# Patient Record
Sex: Female | Born: 1980 | Race: White | Hispanic: No
Health system: Southern US, Community
[De-identification: ages and names within clinical notes are randomized; demographics above are authoritative.]

## PROBLEM LIST (undated history)

## (undated) DIAGNOSIS — N83209 Unspecified ovarian cyst, unspecified side: Secondary | ICD-10-CM

## (undated) DIAGNOSIS — F419 Anxiety disorder, unspecified: Secondary | ICD-10-CM

## (undated) DIAGNOSIS — I456 Pre-excitation syndrome: Secondary | ICD-10-CM

## (undated) DIAGNOSIS — K219 Gastro-esophageal reflux disease without esophagitis: Secondary | ICD-10-CM

## (undated) DIAGNOSIS — F329 Major depressive disorder, single episode, unspecified: Secondary | ICD-10-CM

## (undated) DIAGNOSIS — F32A Depression, unspecified: Secondary | ICD-10-CM

---
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
## 2009-12-07 DIAGNOSIS — N83209 Unspecified ovarian cyst, unspecified side: Secondary | ICD-10-CM

## 2009-12-07 HISTORY — DX: Unspecified ovarian cyst, unspecified side: N83.209

## 2010-10-31 ENCOUNTER — Encounter: Payer: Self-pay | Admitting: Emergency Medicine

## 2010-11-01 ENCOUNTER — Observation Stay (HOSPITAL_COMMUNITY)
Admission: AD | Admit: 2010-11-01 | Discharge: 2010-11-02 | Payer: Self-pay | Source: Home / Self Care | Admitting: Obstetrics and Gynecology

## 2010-11-08 ENCOUNTER — Ambulatory Visit (HOSPITAL_COMMUNITY)
Admission: AD | Admit: 2010-11-08 | Discharge: 2010-11-08 | Payer: Self-pay | Source: Home / Self Care | Admitting: Obstetrics and Gynecology

## 2011-02-17 LAB — URINALYSIS, ROUTINE W REFLEX MICROSCOPIC
Hgb urine dipstick: NEGATIVE
Nitrite: NEGATIVE
Protein, ur: NEGATIVE mg/dL
Specific Gravity, Urine: 1.022 (ref 1.005–1.030)
Urobilinogen, UA: 0.2 mg/dL (ref 0.0–1.0)

## 2011-02-17 LAB — CBC
HCT: 29.7 % — ABNORMAL LOW (ref 36.0–46.0)
HCT: 30.3 % — ABNORMAL LOW (ref 36.0–46.0)
HCT: 31.4 % — ABNORMAL LOW (ref 36.0–46.0)
HCT: 35 % — ABNORMAL LOW (ref 36.0–46.0)
Hemoglobin: 10.5 g/dL — ABNORMAL LOW (ref 12.0–15.0)
Hemoglobin: 12.2 g/dL (ref 12.0–15.0)
MCH: 31.6 pg (ref 26.0–34.0)
MCH: 31.6 pg (ref 26.0–34.0)
MCH: 32.6 pg (ref 26.0–34.0)
MCHC: 34.7 g/dL (ref 30.0–36.0)
MCHC: 34.8 g/dL (ref 30.0–36.0)
MCHC: 34.9 g/dL (ref 30.0–36.0)
MCHC: 35.1 g/dL (ref 30.0–36.0)
MCHC: 35.3 g/dL (ref 30.0–36.0)
MCV: 88.6 fL (ref 78.0–100.0)
MCV: 89.6 fL (ref 78.0–100.0)
MCV: 93.2 fL (ref 78.0–100.0)
MCV: 94.4 fL (ref 78.0–100.0)
Platelets: 206 10*3/uL (ref 150–400)
Platelets: 214 10*3/uL (ref 150–400)
Platelets: 221 10*3/uL (ref 150–400)
Platelets: 260 10*3/uL (ref 150–400)
RBC: 3.35 MIL/uL — ABNORMAL LOW (ref 3.87–5.11)
RBC: 4.05 MIL/uL (ref 3.87–5.11)
RDW: 12.6 % (ref 11.5–15.5)
RDW: 12.6 % (ref 11.5–15.5)
RDW: 12.6 % (ref 11.5–15.5)
RDW: 12.7 % (ref 11.5–15.5)
RDW: 12.8 % (ref 11.5–15.5)
WBC: 5.5 10*3/uL (ref 4.0–10.5)
WBC: 7.7 10*3/uL (ref 4.0–10.5)

## 2011-02-17 LAB — HEPATIC FUNCTION PANEL
AST: 67 U/L — ABNORMAL HIGH (ref 0–37)
Albumin: 3.8 g/dL (ref 3.5–5.2)
Albumin: 3.9 g/dL (ref 3.5–5.2)
Alkaline Phosphatase: 50 U/L (ref 39–117)
Alkaline Phosphatase: 50 U/L (ref 39–117)
Bilirubin, Direct: 0.1 mg/dL (ref 0.0–0.3)
Bilirubin, Direct: 0.3 mg/dL (ref 0.0–0.3)
Indirect Bilirubin: 0.9 mg/dL (ref 0.3–0.9)
Total Bilirubin: 0.5 mg/dL (ref 0.3–1.2)
Total Bilirubin: 1.2 mg/dL (ref 0.3–1.2)

## 2011-02-17 LAB — DIFFERENTIAL
Basophils Absolute: 0 10*3/uL (ref 0.0–0.1)
Basophils Absolute: 0.1 10*3/uL (ref 0.0–0.1)
Basophils Relative: 0 % (ref 0–1)
Basophils Relative: 2 % — ABNORMAL HIGH (ref 0–1)
Eosinophils Absolute: 0.1 10*3/uL (ref 0.0–0.7)
Eosinophils Absolute: 0.1 10*3/uL (ref 0.0–0.7)
Eosinophils Relative: 1 % (ref 0–5)
Eosinophils Relative: 2 % (ref 0–5)
Monocytes Absolute: 0.4 10*3/uL (ref 0.1–1.0)
Neutrophils Relative %: 70 % (ref 43–77)

## 2011-02-17 LAB — POCT I-STAT, CHEM 8
Creatinine, Ser: 0.8 mg/dL (ref 0.4–1.2)
HCT: 38 % (ref 36.0–46.0)
Hemoglobin: 12.9 g/dL (ref 12.0–15.0)
Potassium: 3.9 mEq/L (ref 3.5–5.1)
Sodium: 139 mEq/L (ref 135–145)
TCO2: 24 mmol/L (ref 0–100)

## 2011-02-17 LAB — HCG, QUANTITATIVE, PREGNANCY
hCG, Beta Chain, Quant, S: 43 m[IU]/mL — ABNORMAL HIGH (ref <5)
hCG, Beta Chain, Quant, S: 51 m[IU]/mL — ABNORMAL HIGH (ref <5)
hCG, Beta Chain, Quant, S: 86 m[IU]/mL — ABNORMAL HIGH (ref <5)

## 2011-02-17 LAB — LIPASE, BLOOD: Lipase: 17 U/L (ref 11–59)

## 2011-02-17 LAB — ABO/RH: ABO/RH(D): O POS

## 2011-02-17 LAB — GLUCOSE, CAPILLARY: Glucose-Capillary: 113 mg/dL — ABNORMAL HIGH (ref 70–99)

## 2011-06-03 ENCOUNTER — Other Ambulatory Visit: Payer: Self-pay | Admitting: Obstetrics and Gynecology

## 2011-06-03 DIAGNOSIS — Z1231 Encounter for screening mammogram for malignant neoplasm of breast: Secondary | ICD-10-CM

## 2011-06-03 DIAGNOSIS — Z803 Family history of malignant neoplasm of breast: Secondary | ICD-10-CM

## 2011-06-18 ENCOUNTER — Ambulatory Visit: Payer: Self-pay

## 2012-12-27 LAB — OB RESULTS CONSOLE GC/CHLAMYDIA
Chlamydia: NEGATIVE
Gonorrhea: NEGATIVE

## 2012-12-27 LAB — OB RESULTS CONSOLE HEPATITIS B SURFACE ANTIGEN: Hepatitis B Surface Ag: NEGATIVE

## 2013-07-05 LAB — OB RESULTS CONSOLE GBS: GBS: NEGATIVE

## 2013-07-28 ENCOUNTER — Inpatient Hospital Stay (HOSPITAL_COMMUNITY)
Admission: AD | Admit: 2013-07-28 | Discharge: 2013-07-30 | DRG: 371 | Disposition: A | Discharge status: Home / Self Care | Payer: BC Managed Care – PPO | Source: Ambulatory Visit | Attending: Obstetrics and Gynecology | Admitting: Obstetrics and Gynecology

## 2013-07-28 ENCOUNTER — Inpatient Hospital Stay (HOSPITAL_COMMUNITY): Payer: BC Managed Care – PPO | Admitting: Anesthesiology

## 2013-07-28 ENCOUNTER — Encounter (HOSPITAL_COMMUNITY): Payer: Self-pay | Admitting: Anesthesiology

## 2013-07-28 ENCOUNTER — Encounter (HOSPITAL_COMMUNITY): Payer: Self-pay

## 2013-07-28 ENCOUNTER — Encounter (HOSPITAL_COMMUNITY)
Admission: AD | Disposition: A | Discharge status: Home / Self Care | Payer: Self-pay | Source: Ambulatory Visit | Attending: Obstetrics and Gynecology

## 2013-07-28 DIAGNOSIS — O411090 Infection of amniotic sac and membranes, unspecified, unspecified trimester, not applicable or unspecified: Secondary | ICD-10-CM | POA: Diagnosis present

## 2013-07-28 DIAGNOSIS — Z98891 History of uterine scar from previous surgery: Secondary | ICD-10-CM

## 2013-07-28 DIAGNOSIS — Z8759 Personal history of other complications of pregnancy, childbirth and the puerperium: Secondary | ICD-10-CM

## 2013-07-28 DIAGNOSIS — O4290 Premature rupture of membranes, unspecified as to length of time between rupture and onset of labor, unspecified weeks of gestation: Secondary | ICD-10-CM | POA: Diagnosis not present

## 2013-07-28 HISTORY — DX: Anxiety disorder, unspecified: F41.9

## 2013-07-28 HISTORY — DX: Pre-excitation syndrome: I45.6

## 2013-07-28 HISTORY — DX: Depression, unspecified: F32.A

## 2013-07-28 HISTORY — DX: Unspecified ovarian cyst, unspecified side: N83.209

## 2013-07-28 HISTORY — DX: Major depressive disorder, single episode, unspecified: F32.9

## 2013-07-28 LAB — CBC
MCH: 32.3 pg (ref 26.0–34.0)
MCV: 91.1 fL (ref 78.0–100.0)
Platelets: 266 10*3/uL (ref 150–400)
RDW: 13.8 % (ref 11.5–15.5)

## 2013-07-28 SURGERY — Surgical Case
Anesthesia: Epidural | Site: Abdomen | Wound class: Clean Contaminated

## 2013-07-28 MED ORDER — KETOROLAC TROMETHAMINE 60 MG/2ML IM SOLN
INTRAMUSCULAR | Status: AC
  Administered 2013-07-28: 60 mg via INTRAMUSCULAR
  Filled 2013-07-28: qty 2

## 2013-07-28 MED ORDER — TERBUTALINE SULFATE 1 MG/ML IJ SOLN: 0.2500 mg | Freq: Once | INTRAMUSCULAR | Status: DC | PRN

## 2013-07-28 MED ORDER — ACETAMINOPHEN 325 MG PO TABS
650.0000 mg | ORAL_TABLET | ORAL | Status: DC | PRN
  Administered 2013-07-28 (×2): 650 mg via ORAL
  Filled 2013-07-28 (×3): qty 2

## 2013-07-28 MED ORDER — CEFAZOLIN SODIUM-DEXTROSE 2-3 GM-% IV SOLR
INTRAVENOUS | Status: AC
  Filled 2013-07-28: qty 50

## 2013-07-28 MED ORDER — BUTORPHANOL TARTRATE 1 MG/ML IJ SOLN
1.0000 mg | Freq: Once | INTRAMUSCULAR | Status: AC
  Administered 2013-07-28: 1 mg via INTRAMUSCULAR
  Filled 2013-07-28: qty 1

## 2013-07-28 MED ORDER — CITRIC ACID-SODIUM CITRATE 334-500 MG/5ML PO SOLN
30.0000 mL | ORAL | Status: DC | PRN
  Administered 2013-07-28: 30 mL via ORAL
  Filled 2013-07-28: qty 15

## 2013-07-28 MED ORDER — ONDANSETRON HCL 4 MG/2ML IJ SOLN: 4.0000 mg | Freq: Four times a day (QID) | INTRAMUSCULAR | Status: DC | PRN

## 2013-07-28 MED ORDER — PHENYLEPHRINE 40 MCG/ML (10ML) SYRINGE FOR IV PUSH (FOR BLOOD PRESSURE SUPPORT)
80.0000 ug | PREFILLED_SYRINGE | INTRAVENOUS | Status: DC | PRN
  Filled 2013-07-28: qty 5

## 2013-07-28 MED ORDER — OXYTOCIN BOLUS FROM INFUSION: 500.0000 mL | INTRAVENOUS | Status: DC

## 2013-07-28 MED ORDER — CEFAZOLIN SODIUM 1-5 GM-% IV SOLN
INTRAVENOUS | Status: DC | PRN
  Administered 2013-07-28: 2 g via INTRAVENOUS

## 2013-07-28 MED ORDER — SODIUM BICARBONATE 8.4 % IV SOLN
INTRAVENOUS | Status: AC
  Filled 2013-07-28: qty 50

## 2013-07-28 MED ORDER — GENTAMICIN SULFATE 40 MG/ML IJ SOLN
130.0000 mg | Freq: Three times a day (TID) | INTRAVENOUS | Status: DC
  Administered 2013-07-28: 130 mg via INTRAVENOUS
  Filled 2013-07-28 (×4): qty 3.25

## 2013-07-28 MED ORDER — LACTATED RINGERS IV SOLN
INTRAVENOUS | Status: DC
  Administered 2013-07-28: 17:00:00 via INTRAVENOUS

## 2013-07-28 MED ORDER — SODIUM CHLORIDE 0.9 % IV SOLN
2.0000 g | Freq: Once | INTRAVENOUS | Status: AC
  Administered 2013-07-28: 2 g via INTRAVENOUS
  Filled 2013-07-28: qty 2000

## 2013-07-28 MED ORDER — PROMETHAZINE HCL 25 MG/ML IJ SOLN: 6.2500 mg | INTRAMUSCULAR | Status: DC | PRN

## 2013-07-28 MED ORDER — SODIUM CHLORIDE 0.9 % IV SOLN
1.0000 g | INTRAVENOUS | Status: DC
  Administered 2013-07-28: 1 g via INTRAVENOUS
  Filled 2013-07-28 (×6): qty 1000

## 2013-07-28 MED ORDER — LIDOCAINE HCL (PF) 1 % IJ SOLN: 30.0000 mL | INTRAMUSCULAR | Status: DC | PRN

## 2013-07-28 MED ORDER — FENTANYL CITRATE 0.05 MG/ML IJ SOLN
25.0000 ug | INTRAMUSCULAR | Status: DC | PRN
  Administered 2013-07-28: 50 ug via INTRAVENOUS

## 2013-07-28 MED ORDER — SODIUM CHLORIDE 0.9 % IV SOLN: 1.0000 g | INTRAVENOUS | Status: DC

## 2013-07-28 MED ORDER — FLEET ENEMA 7-19 GM/118ML RE ENEM: 1.0000 | ENEMA | RECTAL | Status: DC | PRN

## 2013-07-28 MED ORDER — FENTANYL 2.5 MCG/ML BUPIVACAINE 1/10 % EPIDURAL INFUSION (WH - ANES)
14.0000 mL/h | INTRAMUSCULAR | Status: DC | PRN
  Administered 2013-07-28: 14 mL/h via EPIDURAL
  Filled 2013-07-28 (×3): qty 125

## 2013-07-28 MED ORDER — OXYCODONE-ACETAMINOPHEN 5-325 MG PO TABS: 1.0000 | ORAL_TABLET | ORAL | Status: DC | PRN

## 2013-07-28 MED ORDER — FENTANYL CITRATE 0.05 MG/ML IJ SOLN
INTRAMUSCULAR | Status: AC
  Administered 2013-07-28: 50 ug via INTRAVENOUS
  Filled 2013-07-28: qty 2

## 2013-07-28 MED ORDER — PHENYLEPHRINE 40 MCG/ML (10ML) SYRINGE FOR IV PUSH (FOR BLOOD PRESSURE SUPPORT): 80.0000 ug | PREFILLED_SYRINGE | INTRAVENOUS | Status: DC | PRN

## 2013-07-28 MED ORDER — LACTATED RINGERS IV SOLN: 500.0000 mL | INTRAVENOUS | Status: DC | PRN

## 2013-07-28 MED ORDER — ONDANSETRON HCL 4 MG/2ML IJ SOLN
INTRAMUSCULAR | Status: DC | PRN
  Administered 2013-07-28: 4 mg via INTRAVENOUS

## 2013-07-28 MED ORDER — LIDOCAINE-EPINEPHRINE (PF) 2 %-1:200000 IJ SOLN
INTRAMUSCULAR | Status: AC
  Filled 2013-07-28: qty 20

## 2013-07-28 MED ORDER — MEPERIDINE HCL 25 MG/ML IJ SOLN
INTRAMUSCULAR | Status: AC
  Filled 2013-07-28: qty 1

## 2013-07-28 MED ORDER — OXYTOCIN 10 UNIT/ML IJ SOLN
INTRAMUSCULAR | Status: AC
  Filled 2013-07-28: qty 4

## 2013-07-28 MED ORDER — SCOPOLAMINE 1 MG/3DAYS TD PT72
MEDICATED_PATCH | TRANSDERMAL | Status: AC
  Administered 2013-07-28: 1.5 mg via TRANSDERMAL
  Filled 2013-07-28: qty 1

## 2013-07-28 MED ORDER — MEPERIDINE HCL 25 MG/ML IJ SOLN
INTRAMUSCULAR | Status: DC | PRN
  Administered 2013-07-28: 12.5 mg via INTRAVENOUS

## 2013-07-28 MED ORDER — IBUPROFEN 600 MG PO TABS: 600.0000 mg | ORAL_TABLET | Freq: Four times a day (QID) | ORAL | Status: DC | PRN

## 2013-07-28 MED ORDER — EPHEDRINE 5 MG/ML INJ
10.0000 mg | INTRAVENOUS | Status: DC | PRN
  Filled 2013-07-28: qty 4

## 2013-07-28 MED ORDER — OXYTOCIN 40 UNITS IN LACTATED RINGERS INFUSION - SIMPLE MED: 62.5000 mL/h | INTRAVENOUS | Status: DC

## 2013-07-28 MED ORDER — OXYTOCIN 40 UNITS IN LACTATED RINGERS INFUSION - SIMPLE MED
1.0000 m[IU]/min | INTRAVENOUS | Status: DC
  Administered 2013-07-28: 1 m[IU]/min via INTRAVENOUS

## 2013-07-28 MED ORDER — SODIUM BICARBONATE 8.4 % IV SOLN
INTRAVENOUS | Status: DC | PRN
  Administered 2013-07-28: 7 mL via EPIDURAL
  Administered 2013-07-28: 3 mL via EPIDURAL
  Administered 2013-07-28: 5 mL via EPIDURAL

## 2013-07-28 MED ORDER — OXYTOCIN 10 UNIT/ML IJ SOLN
40.0000 [IU] | INTRAVENOUS | Status: DC | PRN
  Administered 2013-07-28: 40 [IU] via INTRAVENOUS

## 2013-07-28 MED ORDER — LACTATED RINGERS IV SOLN: INTRAVENOUS | Status: DC

## 2013-07-28 MED ORDER — MORPHINE SULFATE 0.5 MG/ML IJ SOLN
INTRAMUSCULAR | Status: AC
  Filled 2013-07-28: qty 10

## 2013-07-28 MED ORDER — PROMETHAZINE HCL 25 MG/ML IJ SOLN
12.5000 mg | Freq: Once | INTRAMUSCULAR | Status: AC
  Administered 2013-07-28: 12.5 mg via INTRAMUSCULAR
  Filled 2013-07-28: qty 1

## 2013-07-28 MED ORDER — LACTATED RINGERS IV SOLN
INTRAVENOUS | Status: DC | PRN
  Administered 2013-07-28 (×3): via INTRAVENOUS

## 2013-07-28 MED ORDER — PHENYLEPHRINE HCL 10 MG/ML IJ SOLN
INTRAMUSCULAR | Status: DC | PRN
  Administered 2013-07-28 (×4): 80 ug via INTRAVENOUS
  Administered 2013-07-28: 40 ug via INTRAVENOUS
  Administered 2013-07-28: 80 ug via INTRAVENOUS
  Administered 2013-07-28: 40 ug via INTRAVENOUS

## 2013-07-28 MED ORDER — PHENYLEPHRINE 40 MCG/ML (10ML) SYRINGE FOR IV PUSH (FOR BLOOD PRESSURE SUPPORT)
PREFILLED_SYRINGE | INTRAVENOUS | Status: AC
  Filled 2013-07-28: qty 5

## 2013-07-28 MED ORDER — PHENYLEPHRINE 40 MCG/ML (10ML) SYRINGE FOR IV PUSH (FOR BLOOD PRESSURE SUPPORT)
PREFILLED_SYRINGE | INTRAVENOUS | Status: AC
  Filled 2013-07-28: qty 15

## 2013-07-28 MED ORDER — MEPERIDINE HCL 25 MG/ML IJ SOLN: 6.2500 mg | INTRAMUSCULAR | Status: DC | PRN

## 2013-07-28 MED ORDER — KETOROLAC TROMETHAMINE 60 MG/2ML IM SOLN: 60.0000 mg | Freq: Once | INTRAMUSCULAR | Status: AC | PRN

## 2013-07-28 MED ORDER — OXYTOCIN 40 UNITS IN LACTATED RINGERS INFUSION - SIMPLE MED
1.0000 m[IU]/min | INTRAVENOUS | Status: DC
  Filled 2013-07-28: qty 1000

## 2013-07-28 MED ORDER — SCOPOLAMINE 1 MG/3DAYS TD PT72: 1.0000 | MEDICATED_PATCH | Freq: Once | TRANSDERMAL | Status: DC

## 2013-07-28 MED ORDER — EPHEDRINE 5 MG/ML INJ
INTRAVENOUS | Status: AC
  Filled 2013-07-28: qty 10

## 2013-07-28 MED ORDER — METOCLOPRAMIDE HCL 5 MG/ML IJ SOLN
INTRAMUSCULAR | Status: DC | PRN
  Administered 2013-07-28: 10 mg via INTRAVENOUS

## 2013-07-28 MED ORDER — ONDANSETRON HCL 4 MG/2ML IJ SOLN
INTRAMUSCULAR | Status: AC
  Filled 2013-07-28: qty 2

## 2013-07-28 MED ORDER — LACTATED RINGERS IV SOLN
INTRAVENOUS | Status: DC | PRN
  Administered 2013-07-28: 21:00:00 via INTRAVENOUS

## 2013-07-28 MED ORDER — DIPHENHYDRAMINE HCL 50 MG/ML IJ SOLN: 12.5000 mg | INTRAMUSCULAR | Status: DC | PRN

## 2013-07-28 MED ORDER — EPHEDRINE 5 MG/ML INJ
10.0000 mg | INTRAVENOUS | Status: DC | PRN
  Administered 2013-07-28: 10 mg via INTRAVENOUS

## 2013-07-28 MED ORDER — LACTATED RINGERS IV SOLN
500.0000 mL | Freq: Once | INTRAVENOUS | Status: AC
  Administered 2013-07-28: 500 mL via INTRAVENOUS

## 2013-07-28 SURGICAL SUPPLY — 30 items
CLAMP CORD UMBIL (MISCELLANEOUS) IMPLANT
CLOTH BEACON ORANGE TIMEOUT ST (SAFETY) ×2 IMPLANT
DERMABOND ADHESIVE PROPEN (GAUZE/BANDAGES/DRESSINGS) ×1
DERMABOND ADVANCED (GAUZE/BANDAGES/DRESSINGS) ×1
DERMABOND ADVANCED .7 DNX12 (GAUZE/BANDAGES/DRESSINGS) ×1 IMPLANT
DERMABOND ADVANCED .7 DNX6 (GAUZE/BANDAGES/DRESSINGS) ×1 IMPLANT
DRAPE LG THREE QUARTER DISP (DRAPES) ×2 IMPLANT
DRSG OPSITE POSTOP 4X10 (GAUZE/BANDAGES/DRESSINGS) ×2 IMPLANT
DURAPREP 26ML APPLICATOR (WOUND CARE) ×2 IMPLANT
ELECT REM PT RETURN 9FT ADLT (ELECTROSURGICAL) ×2
ELECTRODE REM PT RTRN 9FT ADLT (ELECTROSURGICAL) ×1 IMPLANT
EXTRACTOR VACUUM M CUP 4 TUBE (SUCTIONS) IMPLANT
GLOVE BIO SURGEON STRL SZ7 (GLOVE) ×2 IMPLANT
GOWN STRL REIN XL XLG (GOWN DISPOSABLE) ×4 IMPLANT
KIT ABG SYR 3ML LUER SLIP (SYRINGE) ×2 IMPLANT
NEEDLE HYPO 25X5/8 SAFETYGLIDE (NEEDLE) ×2 IMPLANT
NS IRRIG 1000ML POUR BTL (IV SOLUTION) ×2 IMPLANT
PACK C SECTION WH (CUSTOM PROCEDURE TRAY) ×2 IMPLANT
PAD OB MATERNITY 4.3X12.25 (PERSONAL CARE ITEMS) ×2 IMPLANT
STAPLER VISISTAT 35W (STAPLE) IMPLANT
SUT CHROMIC 0 CTX 36 (SUTURE) ×4 IMPLANT
SUT MON AB 4-0 PS1 27 (SUTURE) ×2 IMPLANT
SUT PDS AB 0 CT 36 (SUTURE) ×2 IMPLANT
SUT PLAIN 0 NONE (SUTURE) IMPLANT
SUT PLAIN 2 0 XLH (SUTURE) IMPLANT
SUT VIC AB 3-0 CT1 27 (SUTURE) ×1
SUT VIC AB 3-0 CT1 TAPERPNT 27 (SUTURE) ×1 IMPLANT
TOWEL OR 17X24 6PK STRL BLUE (TOWEL DISPOSABLE) ×2 IMPLANT
TRAY FOLEY CATH 14FR (SET/KITS/TRAYS/PACK) ×2 IMPLANT
WATER STERILE IRR 1000ML POUR (IV SOLUTION) ×2 IMPLANT

## 2013-07-28 NOTE — Transfer of Care (Signed)
Immediate Anesthesia Transfer of Care Note  Patient: Brooke Carroll  Procedure(s) Performed: Procedure(s): CESAREAN SECTION (N/A)  Patient Location: PACU  Anesthesia Type:Spinal  Level of Consciousness: awake, alert  and oriented  Airway & Oxygen Therapy: Patient Spontanous Breathing  Post-op Assessment: Report given to PACU RN and Post -op Vital signs reviewed and stable  Post vital signs: Reviewed and stable  Complications: No apparent anesthesia complications

## 2013-07-28 NOTE — MAU Note (Signed)
contractions 

## 2013-07-28 NOTE — Progress Notes (Signed)
Patient ID: Brooke Carroll, female   DOB: May 03, 1981, 32 y.o.   MRN: [id number] Cervix 9 cm for 2 hours slightly edematous fhr 160 with accel with scalp stimulation Materna temp 101 Begin pitocin risk discussed antibiotics

## 2013-07-28 NOTE — H&P (Signed)
Brooke Carroll is a 32 y.o. female presenting at 35.1  With SROM and onset of labor. Neg GBS Maternal Medical History:  Reason for admission: Rupture of membranes and contractions.   Contractions: Onset was 1-2 hours ago.   Frequency: regular.   Perceived severity is moderate.    Fetal activity: Perceived fetal activity is normal.    Prenatal complications: no prenatal complications Prenatal Complications - Diabetes: none.    OB History   Grav Para Term Preterm Abortions TAB SAB Ect Mult Living   3    2 2          Past Medical History  Diagnosis Date  . Ruptured ovarian cyst 2011  . WPW (Wolff-Parkinson-White syndrome)     as child  . Depression   . Anxiety     currently no meds   Past Surgical History  Procedure Laterality Date  . Cardiac electrophysiology study and ablation  1990    WPW   Family History: family history is not on file. Social History:  has no tobacco, alcohol, and drug history on file.   Prenatal Transfer Tool  Maternal Diabetes: No Genetic Screening: Normal Maternal Ultrasounds/Referrals: Normal Fetal Ultrasounds or other Referrals:  None Maternal Substance Abuse:  No Significant Maternal Medications:  None Significant Maternal Lab Results:  None Other Comments:  None  ROS  Dilation: 2.5 Effacement (%): 80 Station: -2 Exam by:: Brooke Morris RN Blood pressure 126/68, pulse 81, resp. rate 20, height 5\' 5"  (1.651 m), weight 84.369 kg (186 lb), SpO2 98.00%. Maternal Exam:  Uterine Assessment: Contraction strength is moderate.  Contraction frequency is regular.   Abdomen: Patient reports no abdominal tenderness. Fundal height is c/w dates.   Estimated fetal weight is 7.   Fetal presentation: vertex  Introitus: Amniotic fluid character: clear.  Cervix: Cervix evaluated by digital exam.   4 cm complete effacement  Physical Exam  Prenatal labs: ABO, Rh: O/Positive/-- (01/21 0000) Antibody:   Rubella: Immune (01/21 0000) RPR:  Nonreactive (01/21 0000)  HBsAg: Negative (01/21 0000)  HIV: Non-reactive (01/21 0000)  GBS: Negative (07/30 0000)   Assessment/Plan: Intrauterine pregnancy at term with SROM and onset of labor Routine labor and delivery   Brooke Carroll S 07/28/2013, 9:36 AM

## 2013-07-28 NOTE — Anesthesia Postprocedure Evaluation (Signed)
  Anesthesia Post-op Note  Patient: Brooke Carroll  Procedure(s) Performed: Procedure(s) (LRB): CESAREAN SECTION (N/A)  Patient Location: PACU  Anesthesia Type: Epidural  Level of Consciousness: awake and alert   Airway and Oxygen Therapy: Patient Spontanous Breathing  Post-op Pain: mild  Post-op Assessment: Post-op Vital signs reviewed, Patient's Cardiovascular Status Stable, Respiratory Function Stable, Patent Airway and No signs of Nausea or vomiting  Last Vitals:  Filed Vitals:   07/28/13 2230  BP:   Pulse: 87  Temp:   Resp: 18    Post-op Vital Signs: stable   Complications: No apparent anesthesia complications

## 2013-07-28 NOTE — Progress Notes (Signed)
Dr Arelia Sneddon notified of pts VE, FHR pattern, contraction pattern, orders received to admit pt.

## 2013-07-28 NOTE — Brief Op Note (Signed)
07/28/2013  9:44 PM  PATIENT:  Brooke Carroll  32 y.o. female  PRE-OPERATIVE DIAGNOSIS:  Failure to Progress  POST-OPERATIVE DIAGNOSIS:  Failure to Progress  PROCEDURE:  Procedure(s): CESAREAN SECTION (N/A)  SURGEON:  Surgeon(s) and Role:    * Juluis Mire, MD - Primary  PHYSICIAN ASSISTANT:   ASSISTANTS: none   ANESTHESIA:   epidural  EBL:  Total I/O In: 2000 [I.V.:2000] Out: 1650 [Urine:800; Blood:850]  BLOOD ADMINISTERED:none  DRAINS: Urinary Catheter (Foley)   LOCAL MEDICATIONS USED:  NONE  SPECIMEN:  No Specimen  DISPOSITION OF SPECIMEN:  N/A  COUNTS:  YES  TOURNIQUET:  * No tourniquets in log *  DICTATION: .Other Dictation: Dictation Number unknown  PLAN OF CARE: Admit to inpatient   PATIENT DISPOSITION:  PACU - hemodynamically stable.   Delay start of Pharmacological VTE agent (>24hrs) due to surgical blood loss or risk of bleeding: yes

## 2013-07-28 NOTE — Anesthesia Preprocedure Evaluation (Signed)
Anesthesia Evaluation  Patient identified by MRN, date of birth, ID band Patient awake    Reviewed: Allergy & Precautions, H&P , NPO status , Patient's Chart, lab work & pertinent test results  Airway Mallampati: II TM Distance: >3 FB Neck ROM: full    Dental no notable dental hx.    Pulmonary neg pulmonary ROS,    Pulmonary exam normal       Cardiovascular negative cardio ROS  + dysrhythmias  WPW that has been ablated   Neuro/Psych negative neurological ROS  negative psych ROS   GI/Hepatic negative GI ROS, Neg liver ROS,   Endo/Other  negative endocrine ROS  Renal/GU negative Renal ROS  negative genitourinary   Musculoskeletal negative musculoskeletal ROS (+)   Abdominal Normal abdominal exam  (+)   Peds  Hematology negative hematology ROS (+)   Anesthesia Other Findings   Reproductive/Obstetrics (+) Pregnancy                           Anesthesia Physical Anesthesia Plan  ASA: II  Anesthesia Plan: Epidural   Post-op Pain Management:    Induction:   Airway Management Planned:   Additional Equipment:   Intra-op Plan:   Post-operative Plan:   Informed Consent: I have reviewed the patients History and Physical, chart, labs and discussed the procedure including the risks, benefits and alternatives for the proposed anesthesia with the patient or authorized representative who has indicated his/her understanding and acceptance.     Plan Discussed with:   Anesthesia Plan Comments:         Anesthesia Quick Evaluation

## 2013-07-28 NOTE — Progress Notes (Signed)
Patient ID: Brooke Carroll, female   DOB: 04-08-1981, 32 y.o.   MRN: [id number] Still a  9 cm despite pitocin  Had been 9 for 5 hours  Cervix with increased edema Precede with cesarean  Section for failure to progress Risk of cesarean section discussed.  These include:  Risk of infection;  Risk of hemorrhage that could require transfusions with the associated risk of aids or hepatitis;  Excessive bleeding could require hysterectomy;  Risk of injury to adjacent organs including bladder, bowel or ureters;  Risk of DVT's and possible pulmonary embolus.  Patient expresses a understanding of indications and risks.;

## 2013-07-28 NOTE — MAU Note (Signed)
Contractions since 3 this morning. Was 1cm in office on Wednesday. Denies LOF. Positive fetal movement.

## 2013-07-28 NOTE — Progress Notes (Signed)
Epidural not charted pulled in PACU, catheter intact.

## 2013-07-28 NOTE — Progress Notes (Signed)
Notified by [employer], RN of pt's cervical exam, gbs neg, regular ctx's, orders to admit

## 2013-07-29 LAB — CBC
Hemoglobin: 9.7 g/dL — ABNORMAL LOW (ref 12.0–15.0)
MCH: 32.1 pg (ref 26.0–34.0)
MCHC: 35 g/dL (ref 30.0–36.0)
Platelets: 189 10*3/uL (ref 150–400)
RBC: 3.02 MIL/uL — ABNORMAL LOW (ref 3.87–5.11)

## 2013-07-29 MED ORDER — FLEET ENEMA 7-19 GM/118ML RE ENEM: 1.0000 | ENEMA | Freq: Every day | RECTAL | Status: DC | PRN

## 2013-07-29 MED ORDER — NALBUPHINE SYRINGE 5 MG/0.5 ML
5.0000 mg | INJECTION | INTRAMUSCULAR | Status: DC | PRN
  Filled 2013-07-29: qty 1

## 2013-07-29 MED ORDER — SODIUM CHLORIDE 0.9 % IJ SOLN: 3.0000 mL | INTRAMUSCULAR | Status: DC | PRN

## 2013-07-29 MED ORDER — KETOROLAC TROMETHAMINE 30 MG/ML IJ SOLN: 30.0000 mg | Freq: Four times a day (QID) | INTRAMUSCULAR | Status: AC | PRN

## 2013-07-29 MED ORDER — LANOLIN HYDROUS EX OINT: 1.0000 "application " | TOPICAL_OINTMENT | CUTANEOUS | Status: DC | PRN

## 2013-07-29 MED ORDER — TETANUS-DIPHTH-ACELL PERTUSSIS 5-2.5-18.5 LF-MCG/0.5 IM SUSP: 0.5000 mL | Freq: Once | INTRAMUSCULAR | Status: DC

## 2013-07-29 MED ORDER — WITCH HAZEL-GLYCERIN EX PADS: 1.0000 "application " | MEDICATED_PAD | CUTANEOUS | Status: DC | PRN

## 2013-07-29 MED ORDER — OXYTOCIN 40 UNITS IN LACTATED RINGERS INFUSION - SIMPLE MED: 62.5000 mL/h | INTRAVENOUS | Status: AC

## 2013-07-29 MED ORDER — NALOXONE HCL 0.4 MG/ML IJ SOLN: 0.4000 mg | INTRAMUSCULAR | Status: DC | PRN

## 2013-07-29 MED ORDER — SODIUM CHLORIDE 0.9 % IV SOLN: 250.0000 mL | INTRAVENOUS | Status: DC

## 2013-07-29 MED ORDER — IBUPROFEN 600 MG PO TABS
600.0000 mg | ORAL_TABLET | Freq: Four times a day (QID) | ORAL | Status: DC
  Administered 2013-07-29 – 2013-07-30 (×6): 600 mg via ORAL
  Filled 2013-07-29 (×6): qty 1

## 2013-07-29 MED ORDER — DIPHENHYDRAMINE HCL 50 MG/ML IJ SOLN
12.5000 mg | INTRAMUSCULAR | Status: DC | PRN
  Administered 2013-07-29: 12.5 mg via INTRAVENOUS
  Filled 2013-07-29: qty 1

## 2013-07-29 MED ORDER — ONDANSETRON HCL 4 MG PO TABS: 4.0000 mg | ORAL_TABLET | ORAL | Status: DC | PRN

## 2013-07-29 MED ORDER — SODIUM CHLORIDE 0.9 % IJ SOLN: 3.0000 mL | Freq: Two times a day (BID) | INTRAMUSCULAR | Status: DC

## 2013-07-29 MED ORDER — DIPHENHYDRAMINE HCL 25 MG PO CAPS: 25.0000 mg | ORAL_CAPSULE | Freq: Four times a day (QID) | ORAL | Status: DC | PRN

## 2013-07-29 MED ORDER — DIBUCAINE 1 % RE OINT: 1.0000 "application " | TOPICAL_OINTMENT | RECTAL | Status: DC | PRN

## 2013-07-29 MED ORDER — ZOLPIDEM TARTRATE 5 MG PO TABS: 5.0000 mg | ORAL_TABLET | Freq: Every evening | ORAL | Status: DC | PRN

## 2013-07-29 MED ORDER — SIMETHICONE 80 MG PO CHEW
80.0000 mg | CHEWABLE_TABLET | Freq: Three times a day (TID) | ORAL | Status: DC
  Administered 2013-07-29 – 2013-07-30 (×4): 80 mg via ORAL

## 2013-07-29 MED ORDER — DIPHENHYDRAMINE HCL 25 MG PO CAPS: 25.0000 mg | ORAL_CAPSULE | ORAL | Status: DC | PRN

## 2013-07-29 MED ORDER — LACTATED RINGERS IV SOLN
INTRAVENOUS | Status: DC
  Administered 2013-07-29: 02:00:00 via INTRAVENOUS

## 2013-07-29 MED ORDER — SENNOSIDES-DOCUSATE SODIUM 8.6-50 MG PO TABS
2.0000 | ORAL_TABLET | Freq: Every day | ORAL | Status: DC
  Administered 2013-07-29 (×2): 2 via ORAL

## 2013-07-29 MED ORDER — OXYCODONE-ACETAMINOPHEN 5-325 MG PO TABS
1.0000 | ORAL_TABLET | ORAL | Status: DC | PRN
  Administered 2013-07-29 – 2013-07-30 (×4): 2 via ORAL
  Filled 2013-07-29 (×4): qty 2

## 2013-07-29 MED ORDER — NALOXONE HCL 1 MG/ML IJ SOLN
1.0000 ug/kg/h | INTRAVENOUS | Status: DC | PRN
  Filled 2013-07-29: qty 2

## 2013-07-29 MED ORDER — METOCLOPRAMIDE HCL 5 MG/ML IJ SOLN: 10.0000 mg | Freq: Three times a day (TID) | INTRAMUSCULAR | Status: DC | PRN

## 2013-07-29 MED ORDER — DIPHENHYDRAMINE HCL 50 MG/ML IJ SOLN: 25.0000 mg | INTRAMUSCULAR | Status: DC | PRN

## 2013-07-29 MED ORDER — MENTHOL 3 MG MT LOZG: 1.0000 | LOZENGE | OROMUCOSAL | Status: DC | PRN

## 2013-07-29 MED ORDER — ONDANSETRON HCL 4 MG/2ML IJ SOLN: 4.0000 mg | Freq: Three times a day (TID) | INTRAMUSCULAR | Status: DC | PRN

## 2013-07-29 MED ORDER — PRENATAL MULTIVITAMIN CH
1.0000 | ORAL_TABLET | Freq: Every day | ORAL | Status: DC
  Administered 2013-07-29 – 2013-07-30 (×2): 1 via ORAL
  Filled 2013-07-29 (×2): qty 1

## 2013-07-29 MED ORDER — SIMETHICONE 80 MG PO CHEW
80.0000 mg | CHEWABLE_TABLET | ORAL | Status: DC | PRN
  Administered 2013-07-29: 80 mg via ORAL

## 2013-07-29 MED ORDER — ONDANSETRON HCL 4 MG/2ML IJ SOLN: 4.0000 mg | INTRAMUSCULAR | Status: DC | PRN

## 2013-07-29 MED ORDER — BISACODYL 10 MG RE SUPP: 10.0000 mg | Freq: Every day | RECTAL | Status: DC | PRN

## 2013-07-29 NOTE — Anesthesia Postprocedure Evaluation (Signed)
  Anesthesia Post-op Note  Anesthesia Post Note  Patient: Brooke Carroll  Procedure(s) Performed: Procedure(s) (LRB): CESAREAN SECTION (N/A)  Anesthesia type: Epidural  Patient location: Mother/Baby  Post pain: Pain level controlled  Post assessment: Post-op Vital signs reviewed  Last Vitals:  Filed Vitals:   07/29/13 0645  BP: 104/55  Pulse: 80  Temp: 37.2 C  Resp: 18    Post vital signs: Reviewed  Level of consciousness:alert  Complications: No apparent anesthesia complications

## 2013-07-29 NOTE — Op Note (Signed)
NAME:  Brooke Carroll, Brooke Carroll NO.:  [id number]  MEDICAL RECORD NO.:  [id number]  LOCATION:  9129                          FACILITY:  [facility]  PHYSICIAN:  Juluis Mire, M.D.   DATE OF BIRTH:  25-Jan-1981  DATE OF PROCEDURE:  07/28/2013 DATE OF DISCHARGE:                              OPERATIVE REPORT   PREOPERATIVE DIAGNOSIS:  Intrauterine pregnancy at term with failure to progress.  Chorioamnionitis.  POSTOPERATIVE DIAGNOSIS: Intrauterine pregnancy at term with failure to progress. Chorioamnionitis.  PROCEDURE:  Low transverse cesarean section.  SURGEON:  Juluis Mire, M.D.  ANESTHESIA:  Epidural.  ESTIMATED BLOOD LOSS:  400 mL.  PACKS:  None.  DRAINS:  Urethral Foley.  INTRAOPERATIVE BLOOD REPLACED:  None.  COMPLICATIONS:  None.  INDICATION:  The patient is a primigravida presented at term with spontaneous onset of labor and premature rupture of membranes.  She progressed to 9 cm dilatation, had arrest for over 5 hours despite Pitocin.  Also developed a maternal temp, we started gentamicin and ampicillin.  Fetal heart rate remained category 1.  The cervix began to have increasing edema.  Decision was to proceed with primary cesarean section for failure to progress.  The risks were explained including the risk of infection.  The risk of hemorrhage that could require transfusion with risk of AIDS or hepatitis.  Risk of injury to adjacent organs including bladder, bowel, ureters that could require further exploratory surgery.  Risk of deep venous thrombosis and pulmonary embolus.  Certain complications could require hysterectomy.  DESCRIPTION OF PROCEDURE:  The patient was taken to the OR and placed in supine position with left lateral tilt.  After satisfactory level of epidural anesthesia was obtained, the abdomen was prepped out with DuraPrep and draped in a sterile field.  A low-transverse skin incision was made, knife carried through the subcutaneous  tissue.  Fascia was entered sharply.  Incision was fashioned laterally.  The fascia taken off the muscle superiorly, inferiorly.  Rectus muscles were separated in the midline.  Perineum was entered sharply.  Incision of peritoneum was extended both superiorly and inferiorly.  A low-transverse bladder flap was developed.  Low transverse uterine incision was begun with knife extended laterally using manual traction.  The infant presented in the vertex presentation, was delivered with elevation of head and fundal pressure.  The infant was a viable female.  Apgars were 8/9.  Placenta was delivered manually.  Uterus was exteriorized for closure.  Uterus was closed in a running locking suture of 0 chromic using a 2-layer closure technique.  Areas of bleeding were brought under control with figure-of- eights of 0 chromic.  Uterus returned to abdominal cavity.  Tubes and ovaries were unremarkable.  We irrigated the pelvis, had good hemostasis.  Clear urine output.  Muscles and peritoneum closed in a running suture of 3-0 Vicryl.  Fascia was closed with running suture of 0 PDS.  Skin was closed in running subcuticular of 4-0 Monocryl and Dermabond.  Sponge, instrument, and needle count report was correct by circulating nurse multiple times.  Urine output remained clear.  The patient once alert was transferred to recovery room in good condition.     Juluis Mire,  M.D.     JSM/MEDQ  D:  07/28/2013  T:  07/29/2013  Job:  [id number]

## 2013-07-29 NOTE — Progress Notes (Signed)
Subjective: Postpartum Day one: Cesarean Delivery Patient reports tolerating PO.    Objective: Vital signs in last 24 hours: Temp:  [97.5 F (36.4 C)-101.2 F (38.4 C)] 99 F (37.2 C) (08/23 0645) Pulse Rate:  [70-110] 80 (08/23 0645) Resp:  [16-20] 18 (08/23 0645) BP: (87-148)/(42-93) 104/55 mmHg (08/23 0645) SpO2:  [96 %-100 %] 96 % (08/23 0645) Weight:  [84.369 kg (186 lb)] 84.369 kg (186 lb) (08/22 1610)  Physical Exam:  General: alert Lochia: appropriate Uterine Fundus: firm Incision: healing well DVT Evaluation: No evidence of DVT seen on physical exam.   Recent Labs  07/28/13 0800 07/29/13 0703  HGB 13.5 9.7*  HCT 38.1 27.7*    Assessment/Plan: Status post Cesarean section. Doing well postoperatively.  Continue current care.  Afton Lavalle S 07/29/2013, 8:03 AM

## 2013-07-30 ENCOUNTER — Encounter (HOSPITAL_COMMUNITY)
Admission: RE | Admit: 2013-07-30 | Discharge: 2013-07-30 | Disposition: A | Discharge status: Home / Self Care | Payer: BC Managed Care – PPO | Source: Ambulatory Visit | Attending: Obstetrics and Gynecology | Admitting: Obstetrics and Gynecology

## 2013-07-30 DIAGNOSIS — O923 Agalactia: Secondary | ICD-10-CM | POA: Insufficient documentation

## 2013-07-30 MED ORDER — OXYCODONE-ACETAMINOPHEN 7.5-325 MG PO TABS: 1.0000 | ORAL_TABLET | ORAL | Status: DC | PRN

## 2013-07-30 NOTE — Progress Notes (Signed)
Subjective: Postpartum Day two: Cesarean Delivery Patient reports tolerating PO, + flatus and no problems voiding.    Objective: Vital signs in last 24 hours: Temp:  [98 F (36.7 C)-98.6 F (37 C)] 98 F (36.7 C) (08/24 0458) Pulse Rate:  [66-86] 66 (08/24 0458) Resp:  [18] 18 (08/24 0458) BP: (90-113)/(51-69) 99/52 mmHg (08/24 0458) SpO2:  [97 %-98 %] 97 % (08/23 1645)  Physical Exam:  General: alert Lochia: appropriate Uterine Fundus: firm Incision: healing well DVT Evaluation: No evidence of DVT seen on physical exam.   Recent Labs  07/28/13 0800 07/29/13 0703  HGB 13.5 9.7*  HCT 38.1 27.7*    Assessment/Plan: Status post Cesarean section. Doing well postoperatively.  Discharge home with standard precautions and return to clinic in 4-6 weeks.  Lateesha Bezold S 07/30/2013, 8:56 AM

## 2013-07-30 NOTE — Discharge Summary (Signed)
Obstetric Discharge Summary Reason for Admission: onset of labor and rupture of membranes Prenatal Procedures: none Intrapartum Procedures: cesarean: low cervical, transverse Postpartum Procedures: none Complications-Operative and Postpartum: none Hemoglobin  Date Value Range Status  07/29/2013 9.7* 12.0 - 15.0 g/dL Final     REPEATED TO VERIFY     DELTA CHECK NOTED     HCT  Date Value Range Status  07/29/2013 27.7* 36.0 - 46.0 % Final    Physical Exam:  General: alert Lochia: appropriate Uterine Fundus: firm Incision: healing well DVT Evaluation: No evidence of DVT seen on physical exam.  Discharge Diagnoses: Term Pregnancy-delivered chorioamnionitis  Discharge Information: Date: 07/30/2013 Activity: pelvic rest Diet: routine Medications: PNV, Ibuprofen and Percocet Condition: stable Instructions: refer to practice specific booklet Discharge to: home   Newborn Data: Live born female  Birth Weight: 8 lb 8.3 oz (3865 g) APGAR: 8, 9  Home with mother.  Brooke Carroll S 07/30/2013, 8:57 AM

## 2013-07-30 NOTE — Progress Notes (Signed)
Patient was referred for history of depression/anxiety. * Referral screened out by Clinical Social Worker because none of the following criteria appear to apply: ~ History of anxiety/depression during this pregnancy, or of post-partum depression. ~ Diagnosis of anxiety and/or depression within last 3 years ~ History of depression due to pregnancy loss/loss of child OR * Patient's symptoms currently being treated with medication and/or therapy. Please contact the Clinical Social Worker if needs arise, or if patient requests.  PNR states symptoms were from age [age] (patient now 64), has been off medication for symptoms since her 29s, and is managing well without medication at this time.

## 2013-07-31 ENCOUNTER — Encounter (HOSPITAL_COMMUNITY): Payer: Self-pay | Admitting: Obstetrics and Gynecology

## 2013-08-01 NOTE — Progress Notes (Signed)
Post discharge chart review completed.  

## 2013-08-03 ENCOUNTER — Inpatient Hospital Stay (HOSPITAL_COMMUNITY): Admission: RE | Admit: 2013-08-03 | Payer: BC Managed Care – PPO | Source: Ambulatory Visit

## 2013-08-30 ENCOUNTER — Encounter (HOSPITAL_COMMUNITY)
Admission: RE | Admit: 2013-08-30 | Discharge: 2013-08-30 | Disposition: A | Discharge status: Home / Self Care | Payer: BC Managed Care – PPO | Source: Ambulatory Visit | Attending: Obstetrics and Gynecology | Admitting: Obstetrics and Gynecology

## 2013-08-30 DIAGNOSIS — O923 Agalactia: Secondary | ICD-10-CM | POA: Insufficient documentation

## 2013-10-06 ENCOUNTER — Ambulatory Visit: Payer: Self-pay

## 2013-10-11 ENCOUNTER — Ambulatory Visit: Payer: Self-pay

## 2013-11-09 ENCOUNTER — Ambulatory Visit: Payer: Self-pay | Admitting: Podiatry

## 2013-11-09 ENCOUNTER — Ambulatory Visit (INDEPENDENT_AMBULATORY_CARE_PROVIDER_SITE_OTHER): Payer: BC Managed Care – PPO | Admitting: Podiatry

## 2013-11-09 ENCOUNTER — Encounter: Payer: Self-pay | Admitting: Podiatry

## 2013-11-09 ENCOUNTER — Ambulatory Visit (INDEPENDENT_AMBULATORY_CARE_PROVIDER_SITE_OTHER): Payer: BC Managed Care – PPO

## 2013-11-09 VITALS — BP 109/64 | HR 65 | Resp 16 | Ht 65.0 in | Wt 145.0 lb

## 2013-11-09 DIAGNOSIS — R52 Pain, unspecified: Secondary | ICD-10-CM

## 2013-11-09 DIAGNOSIS — M722 Plantar fascial fibromatosis: Secondary | ICD-10-CM

## 2013-11-09 DIAGNOSIS — G5760 Lesion of plantar nerve, unspecified lower limb: Secondary | ICD-10-CM

## 2013-11-09 DIAGNOSIS — G5781 Other specified mononeuropathies of right lower limb: Secondary | ICD-10-CM

## 2013-11-09 NOTE — Progress Notes (Signed)
   Subjective:    Patient ID: Brooke Carroll, female    DOB: [birth year], 32 y.o.   MRN: [id number]  "I have a problem with my arch on both feet."  "Numbness in the toes on my right foot.  HPI Comments: N  Tightness, aching, very sore L  Arch pain b/l D  4-5 years O  Gradually  C  Gotten worse A  Get up in the morning, walking T  [facility], cortisone shots,     N  Numbness, tingling a little bit, popping feeling L  Numbness 3rd/4th digits right D  3 months O  Suddenly  C  About the same  A  Walking, stand and move my toes T  None       Review of Systems  Constitutional:       Recent weight change had a baby  All other systems reviewed and are negative.       Objective:   Physical Exam: I have reviewed her past medical history medications and allergies. Vital signs are stable she is alert and oriented x3. Past family history surgical history is noted. Currently postpartum three-month old female name Brooke Carroll. Lower extremity exam reveals strong palpable pulses bilateral capillary fill time to digits one through 5 of the bilateral foot is noted to be immediate. Neurologic sensorium is intact per Semmes-Weinstein monofilament bilateral she does have some tenderness with palpable Mulder's click to the third interdigital space of the right foot. Deep tendon reflexes are intact and symmetrical bilateral. Muscle strength + over 5 dorsiflexors plantar flexors inverters everters all intrinsic musculature is intact. Orthopedic evaluation demonstrates all joints distal to the ankle a full range of motion without crepitation. She does have pain on palpation to the medial calcaneal tubercles bilateral soft tissue increase in density at the plantar fascial calcaneal insertion sites on radiographs is indicative of plantar fasciitis. Cutaneous evaluation demonstrates supple while hydrated cutis no erythema edema cellulitis drainage or odor.          Assessment & Plan:    Assessment: Plantar fasciitis bilateral with compensatory neuroma symptoms third interdigital space right foot.  Plan: We discussed the etiology pathology conservative versus surgical therapies at this point because she is breast-feeding I do not want to give her any medications or injections. 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 We'll followup with her once the cement.

## 2013-11-09 NOTE — Patient Instructions (Signed)
Plantar Fasciitis (Heel Spur Syndrome) with Rehab The plantar fascia is a fibrous, ligament-like, soft-tissue structure that spans the bottom of the foot. Plantar fasciitis is a condition that causes pain in the foot due to inflammation of the tissue. SYMPTOMS   Pain and tenderness on the underneath side of the foot.  Pain that worsens with standing or walking. CAUSES  Plantar fasciitis is caused by irritation and injury to the plantar fascia on the underneath side of the foot. Common mechanisms of injury include:  Direct trauma to bottom of the foot.  Damage to a small nerve that runs under the foot where the main fascia attaches to the heel bone.  Stress placed on the plantar fascia due to bone spurs. RISK INCREASES WITH:   Activities that place stress on the plantar fascia (running, jumping, pivoting, or cutting).  Poor strength and flexibility.  Improperly fitted shoes.  Tight calf muscles.  Flat feet.  Failure to warm-up properly before activity.  Obesity. PREVENTION  Warm up and stretch properly before activity.  Allow for adequate recovery between workouts.  Maintain physical fitness:  Strength, flexibility, and endurance.  Cardiovascular fitness.  Maintain a health body weight.  Avoid stress on the plantar fascia.  Wear properly fitted shoes, including arch supports for individuals who have flat feet. PROGNOSIS  If treated properly, then the symptoms of plantar fasciitis usually resolve without surgery. However, occasionally surgery is necessary. RELATED COMPLICATIONS   Recurrent symptoms that may result in a chronic condition.  Problems of the lower back that are caused by compensating for the injury, such as limping.  Pain or weakness of the foot during push-off following surgery.  Chronic inflammation, scarring, and partial or complete fascia tear, occurring more often from repeated injections. TREATMENT  Treatment initially involves the use of  ice and medication to help reduce pain and inflammation. The use of strengthening and stretching exercises may help reduce pain with activity, especially stretches of the Achilles tendon. These exercises may be performed at home or with a therapist. Your caregiver may recommend that you use heel cups of arch supports to help reduce stress on the plantar fascia. Occasionally, corticosteroid injections are given to reduce inflammation. If symptoms persist for greater than 6 months despite non-surgical (conservative), then surgery may be recommended.  MEDICATION   If pain medication is necessary, then nonsteroidal anti-inflammatory medications, such as aspirin and ibuprofen, or other minor pain relievers, such as acetaminophen, are often recommended.  Do not take pain medication within 7 days before surgery.  Prescription pain relievers may be given if deemed necessary by your caregiver. Use only as directed and only as much as you need.  Corticosteroid injections may be given by your caregiver. These injections should be reserved for the most serious cases, because they may only be given a certain number of times. HEAT AND COLD  Cold treatment (icing) relieves pain and reduces inflammation. Cold treatment should be applied for 10 to 15 minutes every 2 to 3 hours for inflammation and pain and immediately after any activity that aggravates your symptoms. Use ice packs or massage the area with a piece of ice (ice massage).  Heat treatment may be used prior to performing the stretching and strengthening activities prescribed by your caregiver, physical therapist, or athletic trainer. Use a heat pack or soak the injury in warm water. SEEK IMMEDIATE MEDICAL CARE IF:  Treatment seems to offer no benefit, or the condition worsens.  Any medications produce adverse side effects. EXERCISES RANGE   OF MOTION (ROM) AND STRETCHING EXERCISES - Plantar Fasciitis (Heel Spur Syndrome) These exercises may help you  when beginning to rehabilitate your injury. Your symptoms may resolve with or without further involvement from your physician, physical therapist or athletic trainer. While completing these exercises, remember:   Restoring tissue flexibility helps normal motion to return to the joints. This allows healthier, less painful movement and activity.  An effective stretch should be held for at least 30 seconds.  A stretch should never be painful. You should only feel a gentle lengthening or release in the stretched tissue. RANGE OF MOTION - Toe Extension, Flexion  Sit with your right / left leg crossed over your opposite knee.  Grasp your toes and gently pull them back toward the top of your foot. You should feel a stretch on the bottom of your toes and/or foot.  Hold this stretch for __________ seconds.  Now, gently pull your toes toward the bottom of your foot. You should feel a stretch on the top of your toes and or foot.  Hold this stretch for __________ seconds. Repeat __________ times. Complete this stretch __________ times per day.  RANGE OF MOTION - Ankle Dorsiflexion, Active Assisted  Remove shoes and sit on a chair that is preferably not on a carpeted surface.  Place right / left foot under knee. Extend your opposite leg for support.  Keeping your heel down, slide your right / left foot back toward the chair until you feel a stretch at your ankle or calf. If you do not feel a stretch, slide your bottom forward to the edge of the chair, while still keeping your heel down.  Hold this stretch for __________ seconds. Repeat __________ times. Complete this stretch __________ times per day.  STRETCH  Gastroc, Standing  Place hands on wall.  Extend right / left leg, keeping the front knee somewhat bent.  Slightly point your toes inward on your back foot.  Keeping your right / left heel on the floor and your knee straight, shift your weight toward the wall, not allowing your back to  arch.  You should feel a gentle stretch in the right / left calf. Hold this position for __________ seconds. Repeat __________ times. Complete this stretch __________ times per day. STRETCH  Soleus, Standing  Place hands on wall.  Extend right / left leg, keeping the other knee somewhat bent.  Slightly point your toes inward on your back foot.  Keep your right / left heel on the floor, bend your back knee, and slightly shift your weight over the back leg so that you feel a gentle stretch deep in your back calf.  Hold this position for __________ seconds. Repeat __________ times. Complete this stretch __________ times per day. STRETCH  Gastrocsoleus, Standing  Note: This exercise can place a lot of stress on your foot and ankle. Please complete this exercise only if specifically instructed by your caregiver.   Place the ball of your right / left foot on a step, keeping your other foot firmly on the same step.  Hold on to the wall or a rail for balance.  Slowly lift your other foot, allowing your body weight to press your heel down over the edge of the step.  You should feel a stretch in your right / left calf.  Hold this position for __________ seconds.  Repeat this exercise with a slight bend in your right / left knee. Repeat __________ times. Complete this stretch __________ times per day.    STRENGTHENING EXERCISES - Plantar Fasciitis (Heel Spur Syndrome)  These exercises may help you when beginning to rehabilitate your injury. They may resolve your symptoms with or without further involvement from your physician, physical therapist or athletic trainer. While completing these exercises, remember:   Muscles can gain both the endurance and the strength needed for everyday activities through controlled exercises.  Complete these exercises as instructed by your physician, physical therapist or athletic trainer. Progress the resistance and repetitions only as guided. STRENGTH - Towel  Curls  Sit in a chair positioned on a non-carpeted surface.  Place your foot on a towel, keeping your heel on the floor.  Pull the towel toward your heel by only curling your toes. Keep your heel on the floor.  If instructed by your physician, physical therapist or athletic trainer, add ____________________ at the end of the towel. Repeat __________ times. Complete this exercise __________ times per day. STRENGTH - Ankle Inversion  Secure one end of a rubber exercise band/tubing to a fixed object (table, pole). Loop the other end around your foot just before your toes.  Place your fists between your knees. This will focus your strengthening at your ankle.  Slowly, pull your big toe up and in, making sure the band/tubing is positioned to resist the entire motion.  Hold this position for __________ seconds.  Have your muscles resist the band/tubing as it slowly pulls your foot back to the starting position. Repeat __________ times. Complete this exercises __________ times per day.  Document Released: 11/23/2005 Document Revised: 02/15/2012 Document Reviewed: 03/07/2009 [employer] Patient Information 2014 [employer]. Plantar Fasciitis Plantar fasciitis is a common condition that causes foot pain. It is soreness (inflammation) of the band of tough fibrous tissue on the bottom of the foot that runs from the heel bone (calcaneus) to the ball of the foot. The cause of this soreness may be from excessive standing, poor fitting shoes, running on hard surfaces, being overweight, having an abnormal walk, or overuse (this is common in runners) of the painful foot or feet. It is also common in aerobic exercise dancers and ballet dancers. SYMPTOMS  Most people with plantar fasciitis complain of:  Severe pain in the morning on the bottom of their foot especially when taking the first steps out of bed. This pain recedes after a few minutes of walking.  Severe pain is experienced also during walking  following a long period of inactivity.  Pain is worse when walking barefoot or up stairs DIAGNOSIS   Your caregiver will diagnose this condition by examining and feeling your foot.  Special tests such as X-rays of your foot, are usually not needed. PREVENTION   Consult a sports medicine professional before beginning a new exercise program.  Walking programs offer a good workout. With walking there is a lower chance of overuse injuries common to runners. There is less impact and less jarring of the joints.  Begin all new exercise programs slowly. If problems or pain develop, decrease the amount of time or distance until you are at a comfortable level.  Wear good shoes and replace them regularly.  Stretch your foot and the heel cords at the back of the ankle (Achilles tendon) both before and after exercise.  Run or exercise on even surfaces that are not hard. For example, asphalt is better than pavement.  Do not run barefoot on hard surfaces.  If using a treadmill, vary the incline.  Do not continue to workout if you have foot or joint   problems. Seek professional help if they do not improve. HOME CARE INSTRUCTIONS   Avoid activities that cause you pain until you recover.  Use ice or cold packs on the problem or painful areas after working out.  Only take over-the-counter or prescription medicines for pain, discomfort, or fever as directed by your caregiver.  Soft shoe inserts or athletic shoes with air or gel sole cushions may be helpful.  If problems continue or become more severe, consult a sports medicine caregiver or your own health care provider. Cortisone is a potent anti-inflammatory medication that may be injected into the painful area. You can discuss this treatment with your caregiver. MAKE SURE YOU:   Understand these instructions.  Will watch your condition.  Will get help right away if you are not doing well or get worse. Document Released: 08/18/2001 Document  Revised: 02/15/2012 Document Reviewed: 10/17/2008 [employer] Patient Information 2014 [employer].  

## 2014-01-18 ENCOUNTER — Ambulatory Visit (INDEPENDENT_AMBULATORY_CARE_PROVIDER_SITE_OTHER): Payer: BC Managed Care – PPO | Admitting: Podiatry

## 2014-01-18 ENCOUNTER — Encounter: Payer: Self-pay | Admitting: Podiatry

## 2014-01-18 VITALS — BP 138/64 | HR 86 | Resp 12

## 2014-01-18 DIAGNOSIS — M722 Plantar fascial fibromatosis: Secondary | ICD-10-CM

## 2014-01-18 NOTE — Progress Notes (Signed)
   Subjective:    Patient ID: Brooke Carroll, female    DOB: 1981/10/05, 33 y.o.   MRN: [id number]  HPI Comments: DISPENSED ORTHOTICS AND GIVEN INSTRUCTION.''     Review of Systems     Objective:   Physical Exam        Assessment & Plan:

## 2014-01-18 NOTE — Patient Instructions (Signed)

## 2014-02-15 ENCOUNTER — Ambulatory Visit: Payer: BC Managed Care – PPO | Admitting: Podiatry

## 2014-10-08 ENCOUNTER — Encounter: Payer: Self-pay | Admitting: Podiatry

## 2015-01-14 LAB — OB RESULTS CONSOLE RUBELLA ANTIBODY, IGM: Rubella: IMMUNE

## 2015-01-14 LAB — OB RESULTS CONSOLE HIV ANTIBODY (ROUTINE TESTING): HIV: NONREACTIVE

## 2015-01-14 LAB — OB RESULTS CONSOLE GC/CHLAMYDIA
Chlamydia: NEGATIVE
Gonorrhea: NEGATIVE

## 2015-01-14 LAB — OB RESULTS CONSOLE RPR: RPR: NONREACTIVE

## 2015-01-14 LAB — OB RESULTS CONSOLE ANTIBODY SCREEN: ANTIBODY SCREEN: NEGATIVE

## 2015-01-14 LAB — OB RESULTS CONSOLE ABO/RH: RH Type: POSITIVE

## 2015-01-14 LAB — OB RESULTS CONSOLE HEPATITIS B SURFACE ANTIGEN: HEP B S AG: NEGATIVE

## 2015-01-31 ENCOUNTER — Other Ambulatory Visit: Payer: Self-pay | Admitting: Obstetrics and Gynecology

## 2015-02-01 LAB — CYTOLOGY - PAP

## 2015-08-01 NOTE — H&P (Addendum)
Brooke Carroll is a 34 y.o. female presenting for repeat c/s.  Preg complicated by ama with normal nipt.  Desires TOL for VBAC if labors, but if makes it to C/S scheduled date, wants repeat. History OB History    Gravida Para Term Preterm AB TAB SAB Ectopic Multiple Living   Past Medical History  Diagnosis Date  . Ruptured ovarian cyst 2011  . WPW (Wolff-Parkinson-White syndrome)     as child  . Depression   . Anxiety     currently no meds   Past Surgical History  Procedure Laterality Date  . Cardiac electrophysiology study and ablation  1990    WPW  . Cesarean section N/A 07/28/2013    Procedure: CESAREAN SECTION;  Surgeon: Juluis Mire, MD;  Location: [facility];  Service: Obstetrics;  Laterality: N/A;   Family History: family history includes Arthritis in her paternal grandmother; Cancer in her mother; Heart disease in her father; Hypertension in her father; Obesity in her father. Social History:  reports that she has quit smoking. She has never used smokeless tobacco. She reports that she drinks about 0.6 oz of alcohol per week. She reports that she does not use illicit drugs.   Prenatal Transfer Tool  Maternal Diabetes: No Genetic Screening: Normal Maternal Ultrasounds/Referrals: Normal Fetal Ultrasounds or other Referrals:  None Maternal Substance Abuse:  No Significant Maternal Medications:  None Significant Maternal Lab Results:  None Other Comments:  None  ROS    currently breastfeeding. Exam Physical Exam  Prenatal labs: ABO, Rh:   Antibody:   Rubella:   RPR:    HBsAg:    HIV:    GBS:     Assessment/Plan: Prev C/S Plan repeat LSTCS Risks and benefits of C/S were discussed.  All questions were answered and informed consent was obtained.  Plan to proceed with low segment transverse Cesarean Section.  This patient has been seen and examined.   All of her questions were answered.  Labs and vital signs reviewed.  Informed consent has  been obtained.  The History and Physical is current. 08/15/15 1315 DL   Brooke Carroll C 03/15/8118, 4:47 PM

## 2015-08-13 ENCOUNTER — Encounter (HOSPITAL_COMMUNITY): Payer: Self-pay

## 2015-08-14 ENCOUNTER — Encounter (HOSPITAL_COMMUNITY): Payer: Self-pay

## 2015-08-14 ENCOUNTER — Encounter (HOSPITAL_COMMUNITY)
Admission: RE | Admit: 2015-08-14 | Discharge: 2015-08-14 | Disposition: A | Discharge status: Home / Self Care | Payer: Medicaid Other | Source: Ambulatory Visit | Attending: Obstetrics and Gynecology | Admitting: Obstetrics and Gynecology

## 2015-08-14 HISTORY — DX: Gastro-esophageal reflux disease without esophagitis: K21.9

## 2015-08-14 LAB — CBC
HEMATOCRIT: 36.4 % (ref 36.0–46.0)
Hemoglobin: 12.7 g/dL (ref 12.0–15.0)
MCH: 31.5 pg (ref 26.0–34.0)
MCHC: 34.9 g/dL (ref 30.0–36.0)
MCV: 90.3 fL (ref 78.0–100.0)
PLATELETS: 220 10*3/uL (ref 150–400)
RBC: 4.03 MIL/uL (ref 3.87–5.11)
RDW: 14.3 % (ref 11.5–15.5)
WBC: 9.9 10*3/uL (ref 4.0–10.5)

## 2015-08-14 LAB — TYPE AND SCREEN
ABO/RH(D): O POS
ANTIBODY SCREEN: NEGATIVE

## 2015-08-14 MED ORDER — CEFOTETAN DISODIUM 2 G IJ SOLR
2.0000 g | INTRAMUSCULAR | Status: AC
  Administered 2015-08-15: 2 g via INTRAVENOUS
  Filled 2015-08-14: qty 2

## 2015-08-14 NOTE — Patient Instructions (Signed)
Your procedure is scheduled on:08/15/15  Enter through the Main Entrance at :1130 am  Pick up desk phone and dial [phone] and inform us of your arrival.  Please call [phone] if you have any problems the morning of surgery.  Remember: Do not eat food after midnight:tonight Clear liquids are ok until:9am Thursday   You may brush your teeth the morning of surgery.  DO NOT wear jewelry, eye make-up, lipstick,body lotion, or dark fingernail polish.  (Polished toes are ok) You may wear deodorant.  If you are to be admitted after surgery, leave suitcase in car until your room has been assigned. Patients discharged on the day of surgery will not be allowed to drive home. Wear loose fitting, comfortable clothes for your ride home.

## 2015-08-15 ENCOUNTER — Inpatient Hospital Stay (HOSPITAL_COMMUNITY): Payer: Medicaid Other | Admitting: Anesthesiology

## 2015-08-15 ENCOUNTER — Inpatient Hospital Stay (HOSPITAL_COMMUNITY)
Admission: RE | Admit: 2015-08-15 | Discharge: 2015-08-18 | DRG: 766 | Disposition: A | Discharge status: Home / Self Care | Payer: Medicaid Other | Source: Ambulatory Visit | Attending: Obstetrics and Gynecology | Admitting: Obstetrics and Gynecology

## 2015-08-15 ENCOUNTER — Encounter (HOSPITAL_COMMUNITY)
Admission: RE | Disposition: A | Discharge status: Home / Self Care | Payer: Self-pay | Source: Ambulatory Visit | Attending: Obstetrics and Gynecology

## 2015-08-15 ENCOUNTER — Encounter (HOSPITAL_COMMUNITY): Payer: Self-pay

## 2015-08-15 DIAGNOSIS — O99344 Other mental disorders complicating childbirth: Secondary | ICD-10-CM | POA: Diagnosis present

## 2015-08-15 DIAGNOSIS — O3421 Maternal care for scar from previous cesarean delivery: Principal | ICD-10-CM | POA: Diagnosis present

## 2015-08-15 DIAGNOSIS — Z87891 Personal history of nicotine dependence: Secondary | ICD-10-CM

## 2015-08-15 DIAGNOSIS — Z8249 Family history of ischemic heart disease and other diseases of the circulatory system: Secondary | ICD-10-CM | POA: Diagnosis not present

## 2015-08-15 DIAGNOSIS — Z8349 Family history of other endocrine, nutritional and metabolic diseases: Secondary | ICD-10-CM | POA: Diagnosis not present

## 2015-08-15 DIAGNOSIS — F329 Major depressive disorder, single episode, unspecified: Secondary | ICD-10-CM | POA: Diagnosis present

## 2015-08-15 DIAGNOSIS — O3480 Maternal care for other abnormalities of pelvic organs, unspecified trimester: Secondary | ICD-10-CM | POA: Diagnosis present

## 2015-08-15 DIAGNOSIS — O09529 Supervision of elderly multigravida, unspecified trimester: Secondary | ICD-10-CM | POA: Diagnosis not present

## 2015-08-15 DIAGNOSIS — I456 Pre-excitation syndrome: Secondary | ICD-10-CM | POA: Diagnosis present

## 2015-08-15 DIAGNOSIS — Z3A00 Weeks of gestation of pregnancy not specified: Secondary | ICD-10-CM | POA: Diagnosis present

## 2015-08-15 DIAGNOSIS — Z8261 Family history of arthritis: Secondary | ICD-10-CM

## 2015-08-15 DIAGNOSIS — F419 Anxiety disorder, unspecified: Secondary | ICD-10-CM | POA: Diagnosis present

## 2015-08-15 DIAGNOSIS — Z809 Family history of malignant neoplasm, unspecified: Secondary | ICD-10-CM

## 2015-08-15 LAB — RPR: RPR: NONREACTIVE

## 2015-08-15 SURGERY — Surgical Case
Anesthesia: Spinal

## 2015-08-15 MED ORDER — NALBUPHINE HCL 10 MG/ML IJ SOLN
5.0000 mg | Freq: Once | INTRAMUSCULAR | Status: DC | PRN
  Filled 2015-08-15: qty 0.5

## 2015-08-15 MED ORDER — DIBUCAINE 1 % RE OINT: 1.0000 "application " | TOPICAL_OINTMENT | RECTAL | Status: DC | PRN

## 2015-08-15 MED ORDER — NALOXONE HCL 0.4 MG/ML IJ SOLN: 0.4000 mg | INTRAMUSCULAR | Status: DC | PRN

## 2015-08-15 MED ORDER — NALOXONE HCL 1 MG/ML IJ SOLN: 1.0000 ug/kg/h | INTRAVENOUS | Status: DC | PRN

## 2015-08-15 MED ORDER — DIPHENHYDRAMINE HCL 25 MG PO CAPS: 25.0000 mg | ORAL_CAPSULE | Freq: Four times a day (QID) | ORAL | Status: DC | PRN

## 2015-08-15 MED ORDER — LACTATED RINGERS IV SOLN
INTRAVENOUS | Status: DC
  Administered 2015-08-15 (×3): via INTRAVENOUS

## 2015-08-15 MED ORDER — WITCH HAZEL-GLYCERIN EX PADS: 1.0000 "application " | MEDICATED_PAD | CUTANEOUS | Status: DC | PRN

## 2015-08-15 MED ORDER — OXYTOCIN 40 UNITS IN LACTATED RINGERS INFUSION - SIMPLE MED: 62.5000 mL/h | INTRAVENOUS | Status: AC

## 2015-08-15 MED ORDER — OXYCODONE-ACETAMINOPHEN 5-325 MG PO TABS
1.0000 | ORAL_TABLET | ORAL | Status: DC | PRN
  Administered 2015-08-16 – 2015-08-18 (×5): 1 via ORAL
  Filled 2015-08-15 (×5): qty 1

## 2015-08-15 MED ORDER — NALBUPHINE HCL 10 MG/ML IJ SOLN
5.0000 mg | INTRAMUSCULAR | Status: DC | PRN
  Administered 2015-08-15: 5 mg via SUBCUTANEOUS
  Filled 2015-08-15 (×2): qty 0.5

## 2015-08-15 MED ORDER — SENNOSIDES-DOCUSATE SODIUM 8.6-50 MG PO TABS
2.0000 | ORAL_TABLET | ORAL | Status: DC
  Administered 2015-08-15 – 2015-08-17 (×3): 2 via ORAL
  Filled 2015-08-15 (×3): qty 2

## 2015-08-15 MED ORDER — ONDANSETRON HCL 4 MG/2ML IJ SOLN
INTRAMUSCULAR | Status: AC
  Filled 2015-08-15: qty 2

## 2015-08-15 MED ORDER — FENTANYL CITRATE (PF) 100 MCG/2ML IJ SOLN
INTRAMUSCULAR | Status: DC | PRN
  Administered 2015-08-15: 25 ug via INTRATHECAL

## 2015-08-15 MED ORDER — ACETAMINOPHEN 325 MG PO TABS: 650.0000 mg | ORAL_TABLET | ORAL | Status: DC | PRN

## 2015-08-15 MED ORDER — SIMETHICONE 80 MG PO CHEW
80.0000 mg | CHEWABLE_TABLET | ORAL | Status: DC | PRN
  Administered 2015-08-17: 80 mg via ORAL

## 2015-08-15 MED ORDER — SCOPOLAMINE 1 MG/3DAYS TD PT72: 1.0000 | MEDICATED_PATCH | Freq: Once | TRANSDERMAL | Status: DC

## 2015-08-15 MED ORDER — FENTANYL CITRATE (PF) 100 MCG/2ML IJ SOLN
INTRAMUSCULAR | Status: AC
  Filled 2015-08-15: qty 4

## 2015-08-15 MED ORDER — MENTHOL 3 MG MT LOZG: 1.0000 | LOZENGE | OROMUCOSAL | Status: DC | PRN

## 2015-08-15 MED ORDER — ONDANSETRON HCL 4 MG/2ML IJ SOLN
INTRAMUSCULAR | Status: DC | PRN
  Administered 2015-08-15: 4 mg via INTRAVENOUS

## 2015-08-15 MED ORDER — SIMETHICONE 80 MG PO CHEW
80.0000 mg | CHEWABLE_TABLET | ORAL | Status: DC
  Administered 2015-08-15 – 2015-08-17 (×3): 80 mg via ORAL
  Filled 2015-08-15 (×3): qty 1

## 2015-08-15 MED ORDER — OXYTOCIN 10 UNIT/ML IJ SOLN
40.0000 [IU] | INTRAVENOUS | Status: DC | PRN
  Administered 2015-08-15: 40 [IU] via INTRAVENOUS

## 2015-08-15 MED ORDER — PRENATAL MULTIVITAMIN CH
1.0000 | ORAL_TABLET | Freq: Every day | ORAL | Status: DC
  Administered 2015-08-16 – 2015-08-17 (×2): 1 via ORAL
  Filled 2015-08-15 (×2): qty 1

## 2015-08-15 MED ORDER — OXYTOCIN 10 UNIT/ML IJ SOLN
INTRAMUSCULAR | Status: AC
  Filled 2015-08-15: qty 4

## 2015-08-15 MED ORDER — MORPHINE SULFATE 0.5 MG/ML IJ SOLN
INTRAMUSCULAR | Status: AC
  Filled 2015-08-15: qty 100

## 2015-08-15 MED ORDER — ACETAMINOPHEN 160 MG/5ML PO SOLN
975.0000 mg | Freq: Once | ORAL | Status: AC
  Administered 2015-08-15: 975 mg via ORAL

## 2015-08-15 MED ORDER — HYDROMORPHONE HCL 1 MG/ML IJ SOLN: 0.2500 mg | INTRAMUSCULAR | Status: DC | PRN

## 2015-08-15 MED ORDER — OXYCODONE-ACETAMINOPHEN 5-325 MG PO TABS
2.0000 | ORAL_TABLET | ORAL | Status: DC | PRN
  Administered 2015-08-16 (×3): 2 via ORAL
  Filled 2015-08-15 (×3): qty 2

## 2015-08-15 MED ORDER — IBUPROFEN 600 MG PO TABS
600.0000 mg | ORAL_TABLET | Freq: Four times a day (QID) | ORAL | Status: DC
  Administered 2015-08-15 – 2015-08-18 (×11): 600 mg via ORAL
  Filled 2015-08-15 (×12): qty 1

## 2015-08-15 MED ORDER — SCOPOLAMINE 1 MG/3DAYS TD PT72
1.0000 | MEDICATED_PATCH | Freq: Once | TRANSDERMAL | Status: DC
  Administered 2015-08-15: 1.5 mg via TRANSDERMAL

## 2015-08-15 MED ORDER — ONDANSETRON HCL 4 MG/2ML IJ SOLN: 4.0000 mg | Freq: Three times a day (TID) | INTRAMUSCULAR | Status: DC | PRN

## 2015-08-15 MED ORDER — ZOLPIDEM TARTRATE 5 MG PO TABS: 5.0000 mg | ORAL_TABLET | Freq: Every evening | ORAL | Status: DC | PRN

## 2015-08-15 MED ORDER — SCOPOLAMINE 1 MG/3DAYS TD PT72
MEDICATED_PATCH | TRANSDERMAL | Status: AC
  Administered 2015-08-15: 1.5 mg via TRANSDERMAL
  Filled 2015-08-15: qty 1

## 2015-08-15 MED ORDER — ACETAMINOPHEN 160 MG/5ML PO SOLN
ORAL | Status: AC
Start: 2015-08-15 — End: 2015-08-15
  Administered 2015-08-15: 975 mg via ORAL
  Filled 2015-08-15: qty 40.6

## 2015-08-15 MED ORDER — ACETAMINOPHEN 500 MG PO TABS
1000.0000 mg | ORAL_TABLET | Freq: Four times a day (QID) | ORAL | Status: AC
  Administered 2015-08-15: 1000 mg via ORAL
  Filled 2015-08-15: qty 2

## 2015-08-15 MED ORDER — LANOLIN HYDROUS EX OINT: 1.0000 "application " | TOPICAL_OINTMENT | CUTANEOUS | Status: DC | PRN

## 2015-08-15 MED ORDER — PHENYLEPHRINE 8 MG IN D5W 100 ML (0.08MG/ML) PREMIX OPTIME
INJECTION | INTRAVENOUS | Status: AC
  Filled 2015-08-15: qty 100

## 2015-08-15 MED ORDER — CEFAZOLIN SODIUM-DEXTROSE 2-3 GM-% IV SOLR
INTRAVENOUS | Status: AC
  Filled 2015-08-15: qty 50

## 2015-08-15 MED ORDER — SIMETHICONE 80 MG PO CHEW
80.0000 mg | CHEWABLE_TABLET | Freq: Three times a day (TID) | ORAL | Status: DC
  Administered 2015-08-15 – 2015-08-18 (×5): 80 mg via ORAL
  Filled 2015-08-15 (×8): qty 1

## 2015-08-15 MED ORDER — LACTATED RINGERS IV SOLN
Freq: Once | INTRAVENOUS | Status: AC
  Administered 2015-08-15: 12:00:00 via INTRAVENOUS

## 2015-08-15 MED ORDER — LACTATED RINGERS IV SOLN
INTRAVENOUS | Status: DC
  Administered 2015-08-15: via INTRAVENOUS

## 2015-08-15 MED ORDER — PHENYLEPHRINE 8 MG IN D5W 100 ML (0.08MG/ML) PREMIX OPTIME
INJECTION | INTRAVENOUS | Status: DC | PRN
  Administered 2015-08-15: 60 ug/min via INTRAVENOUS

## 2015-08-15 MED ORDER — MORPHINE SULFATE (PF) 0.5 MG/ML IJ SOLN
INTRAMUSCULAR | Status: DC | PRN
  Administered 2015-08-15: .1 mg via EPIDURAL

## 2015-08-15 MED ORDER — DIPHENHYDRAMINE HCL 25 MG PO CAPS: 25.0000 mg | ORAL_CAPSULE | ORAL | Status: DC | PRN

## 2015-08-15 MED ORDER — TETANUS-DIPHTH-ACELL PERTUSSIS 5-2.5-18.5 LF-MCG/0.5 IM SUSP: 0.5000 mL | Freq: Once | INTRAMUSCULAR | Status: DC

## 2015-08-15 MED ORDER — 0.9 % SODIUM CHLORIDE (POUR BTL) OPTIME
TOPICAL | Status: DC | PRN
  Administered 2015-08-15: 1000 mL

## 2015-08-15 MED ORDER — MEPERIDINE HCL 25 MG/ML IJ SOLN: 6.2500 mg | INTRAMUSCULAR | Status: DC | PRN

## 2015-08-15 MED ORDER — DIPHENHYDRAMINE HCL 50 MG/ML IJ SOLN: 12.5000 mg | INTRAMUSCULAR | Status: DC | PRN

## 2015-08-15 MED ORDER — NALBUPHINE HCL 10 MG/ML IJ SOLN
5.0000 mg | INTRAMUSCULAR | Status: DC | PRN
  Filled 2015-08-15: qty 0.5

## 2015-08-15 MED ORDER — SODIUM CHLORIDE 0.9 % IJ SOLN
3.0000 mL | INTRAMUSCULAR | Status: DC | PRN
  Administered 2015-08-16: 3 mL via INTRAVENOUS
  Filled 2015-08-15: qty 3

## 2015-08-15 SURGICAL SUPPLY — 26 items
CLAMP CORD UMBIL (MISCELLANEOUS) IMPLANT
CLOTH BEACON ORANGE TIMEOUT ST (SAFETY) ×2 IMPLANT
DRAPE SHEET LG 3/4 BI-LAMINATE (DRAPES) IMPLANT
DRSG OPSITE POSTOP 4X10 (GAUZE/BANDAGES/DRESSINGS) ×2 IMPLANT
DURAPREP 26ML APPLICATOR (WOUND CARE) ×2 IMPLANT
ELECT REM PT RETURN 9FT ADLT (ELECTROSURGICAL) ×2
ELECTRODE REM PT RTRN 9FT ADLT (ELECTROSURGICAL) ×1 IMPLANT
EXTRACTOR VACUUM M CUP 4 TUBE (SUCTIONS) IMPLANT
GLOVE SURG ORTHO 8.0 STRL STRW (GLOVE) ×2 IMPLANT
GOWN STRL REUS W/TWL LRG LVL3 (GOWN DISPOSABLE) ×4 IMPLANT
KIT ABG SYR 3ML LUER SLIP (SYRINGE) ×2 IMPLANT
NEEDLE HYPO 25X5/8 SAFETYGLIDE (NEEDLE) ×2 IMPLANT
NS IRRIG 1000ML POUR BTL (IV SOLUTION) ×2 IMPLANT
PACK C SECTION WH (CUSTOM PROCEDURE TRAY) ×2 IMPLANT
PAD OB MATERNITY 4.3X12.25 (PERSONAL CARE ITEMS) ×2 IMPLANT
PAD TRENDEL OR TABLE W/STRAP (MISCELLANEOUS) ×2 IMPLANT
PENCIL SMOKE EVAC W/HOLSTER (ELECTROSURGICAL) ×2 IMPLANT
SUT MNCRL 0 VIOLET CTX 36 (SUTURE) ×3 IMPLANT
SUT MON AB 4-0 PS1 27 (SUTURE) ×2 IMPLANT
SUT MONOCRYL 0 CTX 36 (SUTURE) ×3
SUT PDS AB 1 CT  36 (SUTURE)
SUT PDS AB 1 CT 36 (SUTURE) IMPLANT
SUT VIC AB 1 CTX 36 (SUTURE)
SUT VIC AB 1 CTX36XBRD ANBCTRL (SUTURE) IMPLANT
TOWEL OR 17X24 6PK STRL BLUE (TOWEL DISPOSABLE) ×2 IMPLANT
TRAY FOLEY CATH SILVER 14FR (SET/KITS/TRAYS/PACK) ×2 IMPLANT

## 2015-08-15 NOTE — Anesthesia Postprocedure Evaluation (Signed)
  Anesthesia Post-op Note  Patient: Brooke Carroll  Procedure(s) Performed: Procedure(s) with comments: CESAREAN SECTION (N/A) - Repeat edc 08/20/15 NKDA  Patient is awake, responsive, moving her legs, and has signs of resolution of her numbness. Pain and nausea are reasonably well controlled. Vital signs are stable and clinically acceptable. Oxygen saturation is clinically acceptable. There are no apparent anesthetic complications at this time. Patient is ready for discharge.

## 2015-08-15 NOTE — Lactation Note (Signed)
This note was copied from the chart of Brooke Topaz Raglin. Lactation Consultation Note Initial visit at 7 hours of age.  Mom reports leaking and requests hand pump.  Demonstrated hand expression and used colostrum collection container for several drops collected.  Mom has slightly inverted center on left nipple.  Mom reports pumping for 6 months with older child, with much less milk collected on left breast.  Assisted with cross cradle hold, baby not interested in latching at this time and remains STS with mom.  Mom hand pumped with #27 flange on left breast collecting several mls.  Dropper and syring given with verbal instructions.  Report given to [facility] RN. [facility] resources given and discussed.  Encouraged to feed with early cues on demand.  Early newborn behavior discussed.  Mom to call for assist as needed.     Patient Name: Brooke Carroll [id number] Date: 08/15/2015 Reason for consult: Initial assessment   Maternal Data Has patient been taught Hand Expression?: Yes Does the patient have breastfeeding experience prior to this delivery?: Yes  Feeding Feeding Type: Breast Fed  LATCH Score/Interventions Latch: Too sleepy or reluctant, no latch achieved, no sucking elicited.  Audible Swallowing: None  Type of Nipple: Everted at rest and after stimulation (left nipple inverted in center)  Comfort (Breast/Nipple): Soft / non-tender     Hold (Positioning): Assistance needed to correctly position infant at breast and maintain latch. Intervention(s): Breastfeeding basics reviewed;Position options;Support Pillows;Skin to skin  LATCH Score: 5  Lactation Tools Discussed/Used Pump Review: Setup, frequency, and cleaning Initiated by:: JS Date initiated:: 08/15/15   Consult Status Consult Status: Follow-up Date: 08/16/15 Follow-up type: In-patient    Brooke Carroll, Arvella Merles 08/15/2015, 9:15 PM

## 2015-08-15 NOTE — Transfer of Care (Signed)
Immediate Anesthesia Transfer of Care Note  Patient: Brooke Carroll  Procedure(s) Performed: Procedure(s) with comments: CESAREAN SECTION (N/A) - Repeat edc 08/20/15 NKDA  Patient Location: PACU  Anesthesia Type:Spinal  Level of Consciousness: awake  Airway & Oxygen Therapy: Patient Spontanous Breathing  Post-op Assessment: Report given to RN  Post vital signs: Reviewed and stable  Last Vitals:  Filed Vitals:   08/15/15 1142  BP: 129/69  Pulse: 84  Temp: 37.1 C  Resp: 18    Complications: No apparent anesthesia complications

## 2015-08-15 NOTE — Progress Notes (Signed)
[name] was referred for history of depression/anxiety.  Referral is screened out by Clinical Social Worker because none of the following criteria appear to apply: -History of anxiety/depression during this pregnancy, or of post-partum depression. - Diagnosis of anxiety and/or depression within last 3 years or -[name]'s symptoms are currently being treated with medication and/or therapy.  Per prenatal record, [name] experienced symptoms from age 35-25 (is currently 33 years old) and discontinued medications in her 16s.  No concerns or acute symptoms noted in her prenatal record.   Please contact the Clinical Social Worker if needs arise or upon [name] request.

## 2015-08-15 NOTE — Op Note (Signed)
Cesarean Section Procedure Note  Pre-operative Diagnosis: prev C/S desires repeat  Post-operative Diagnosis: same  Surgeon: [name]   Assistants: none  Anesthesia: spinal  Procedure:  Low Segment Transverse cesarean section  Procedure Details  The patient was seen in the Holding Room. The risks, benefits, complications, treatment options, and expected outcomes were discussed with the patient.  The patient concurred with the proposed plan, giving informed consent.  The site of surgery properly noted/marked.. A Time Out was held and the above information confirmed.  After induction of anesthesia, the patient was draped and prepped in the usual sterile manner. A Pfannenstiel incision was made and carried down through the subcutaneous tissue to the fascia. Fascial incision was made and extended transversely. The fascia was separated from the underlying rectus tissue superiorly and inferiorly. The peritoneum was identified and entered. Peritoneal incision was extended longitudinally. The utero-vesical peritoneal reflection was incised transversely and the bladder flap was bluntly freed from the lower uterine segment. A low transverse uterine incision was made. Delivered from vertex presentation was a baby with Apgar scores of 9 at one minute and 9 at five minutes. After the umbilical cord was clamped and cut cord blood was obtained for evaluation. The placenta was removed intact and appeared normal. The uterine outline, tubes and ovaries appeared normal. The uterine incision was closed with running locked sutures of 0 monocryl and imbricated with 0 monocryl. Hemostasis was observed. Lavage was carried out until clear. The peritoneum was then closed with 0 monocryl and rectus muscles plicated in the midline.  After hemostasis was assured, the fascia was then reapproximated with running sutures of 0 Vicryl. Irrigation was applied and after adequate hemostasis was assured, the skin was reapproximated  with subcutaneous sutures using 4-0 monocryl.  Instrument, sponge, and needle counts were correct prior the abdominal closure and at the conclusion of the case. The patient received 2 grams cefotetan preoperatively.  Findings: Viable female  Estimated Blood Loss:  600cc         Specimens: Placenta was sent to labor and delivery         Complications:  None

## 2015-08-15 NOTE — Anesthesia Procedure Notes (Signed)

## 2015-08-15 NOTE — Anesthesia Preprocedure Evaluation (Signed)
Anesthesia Evaluation  Patient identified by MRN, date of birth, ID band Patient awake    Reviewed: Allergy & Precautions, H&P , Patient's Chart, lab work & pertinent test results  Airway Mallampati: II  TM Distance: >3 FB Neck ROM: full    Dental no notable dental hx.    Pulmonary former smoker,    Pulmonary exam normal breath sounds clear to auscultation       Cardiovascular Exercise Tolerance: Good  Rhythm:regular Rate:Normal     Neuro/Psych    GI/Hepatic   Endo/Other    Renal/GU      Musculoskeletal   Abdominal   Peds  Hematology   Anesthesia Other Findings   Reproductive/Obstetrics                             Anesthesia Physical Anesthesia Plan  ASA: II  Anesthesia Plan: Spinal   Post-op Pain Management:    Induction:   Airway Management Planned:   Additional Equipment:   Intra-op Plan:   Post-operative Plan:   Informed Consent: I have reviewed the patients History and Physical, chart, labs and discussed the procedure including the risks, benefits and alternatives for the proposed anesthesia with the patient or authorized representative who has indicated his/her understanding and acceptance.   Dental Advisory Given  Plan Discussed with: CRNA  Anesthesia Plan Comments: (Lab work confirmed with CRNA in room. Platelets okay. Discussed spinal anesthetic, and patient consents to the procedure:  included risk of possible headache,backache, failed block, allergic reaction, and nerve injury. This patient was asked if she had any questions or concerns before the procedure started. )        Anesthesia Quick Evaluation  

## 2015-08-16 ENCOUNTER — Encounter (HOSPITAL_COMMUNITY): Payer: Self-pay | Admitting: Obstetrics and Gynecology

## 2015-08-16 LAB — CBC
HCT: 30.7 % — ABNORMAL LOW (ref 36.0–46.0)
Hemoglobin: 10.6 g/dL — ABNORMAL LOW (ref 12.0–15.0)
MCH: 31.5 pg (ref 26.0–34.0)
MCHC: 34.5 g/dL (ref 30.0–36.0)
MCV: 91.4 fL (ref 78.0–100.0)
PLATELETS: 163 10*3/uL (ref 150–400)
RBC: 3.36 MIL/uL — AB (ref 3.87–5.11)
RDW: 14.4 % (ref 11.5–15.5)
WBC: 9.4 10*3/uL (ref 4.0–10.5)

## 2015-08-16 LAB — BIRTH TISSUE RECOVERY COLLECTION (PLACENTA DONATION)

## 2015-08-16 NOTE — Progress Notes (Signed)
Subjective: Postpartum Day 1: Cesarean Delivery Patient reports tolerating PO.  Complains of R shoulder pain and RUQ pain.   Objective: Vital signs in last 24 hours: Temp:  [97.7 F (36.5 C)-98.8 F (37.1 C)] 98.6 F (37 C) (09/09 0345) Pulse Rate:  [51-84] 52 (09/09 0345) Resp:  [18-25] 18 (09/09 0345) BP: (102-129)/(42-69) 106/48 mmHg (09/09 0345) SpO2:  [97 %-100 %] 97 % (09/09 0345)  Physical Exam:  General: alert Lochia: appropriate Uterine Fundus: firm Incision: healing well DVT Evaluation: No evidence of DVT seen on physical exam. Negative Homan's sign. No cords or calf tenderness. No significant calf/ankle edema. ABd non- distended + BS x2  Recent Labs  08/14/15 1600 08/16/15 0628  HGB 12.7 10.6*  HCT 36.4 30.7*    Assessment/Plan: Status post Cesarean section. Doing well postoperatively.  Continue current care.  CURTIS,CAROL G 08/16/2015, 7:06 AM

## 2015-08-16 NOTE — Addendum Note (Signed)
Addendum  created 08/16/15 0801 by Algis Greenhouse, CRNA   Modules edited: Notes Section   Notes Section:  File: [id number]

## 2015-08-16 NOTE — Anesthesia Postprocedure Evaluation (Signed)
Anesthesia Post Note  Patient: Brooke Carroll  Procedure(s) Performed: Procedure(s) (LRB): CESAREAN SECTION (N/A)  Anesthesia type: Spinal  Patient location: Mother/Baby  Post pain: Pain level controlled  Post assessment: Post-op Vital signs reviewed  Last Vitals:  Filed Vitals:   08/16/15 0345  BP: 106/48  Pulse: 52  Temp: 37 C  Resp: 18    Post vital signs: Reviewed  Level of consciousness: awake  Complications: No apparent anesthesia complications

## 2015-08-17 MED ORDER — IBUPROFEN 600 MG PO TABS: 600.0000 mg | ORAL_TABLET | Freq: Four times a day (QID) | ORAL | Status: DC

## 2015-08-17 MED ORDER — OXYCODONE-ACETAMINOPHEN 5-325 MG PO TABS: 1.0000 | ORAL_TABLET | ORAL | Status: DC | PRN

## 2015-08-17 NOTE — Discharge Summary (Addendum)
Obstetric Discharge Summary Reason for Admission: cesarean section Prenatal Procedures: none Intrapartum Procedures: cesarean: low cervical, transverse Postpartum Procedures: none Complications-Operative and Postpartum: none HEMOGLOBIN  Date Value Ref Range Status  08/16/2015 10.6* 12.0 - 15.0 g/dL Final   HCT  Date Value Ref Range Status  08/16/2015 30.7* 36.0 - 46.0 % Final    Physical Exam:  General: alert, cooperative, appears stated age and no distress Lochia: appropriate Uterine Fundus: firm Incision: healing well DVT Evaluation: No evidence of DVT seen on physical exam.  Discharge Diagnoses: Term Pregnancy-delivered  Discharge Information: Date: 08/17/2015 Activity: pelvic rest Diet: routine Medications: Ibuprofen and Percocet Condition: stable Instructions: refer to practice specific booklet Discharge to: home   Newborn Data: Live born female  Birth Weight: 7 lb 5.5 oz (3330 g) APGAR: 9, 9  Home with mother. Pt had elected to stay until 08/18/15 due to poor infant feeding.  Doing great today and d/c home  Kellan Raffield C 08/17/2015, 8:31 AM   08/18/15

## 2015-08-17 NOTE — Lactation Note (Signed)
This note was copied from the chart of Brooke Carroll. Lactation Consultation Note  Follow up Consultation with mom prior to d/c. Baby is not feeding well at the breast. When asked, Moms goal is to exclusively BF and have a pleasant BF experience. Her older child did not latch and she pumped and bottle fed him for 6 months. She did experience low milk supply later in the process and used Fenugreek and Reglan to increase her supply. She says the Reglan caused severe depression. She does not wish to do extended pumping with this child. Baby is difficult to latch due to thick areola tissue, partially inverted nipples and  infant Sleepiness when at breast. Infant was noted to be sucking on her tongue at rest and does not extend tongue much further that gum line when sucking on a gloved finger, although she was noted to lick her lips and extend tongue out of mouth. Assisted mom with positioning baby at breast in the cradle hold on the left breast. Infant rooting and fussy. Few audible sucks were heard, baby remained fussy and kept pulling off the breast. Areola is thick and difficult to compress. Nipples are large with short shaft and inverted in center. Baby would latch and take a few sucks and pull off. Nipple was often misshapen after latch, was better with increased flow. Had mom pump for 10 minutes with her DEBP and obtained 5 cc EBM and assisted with elongating the nipple. Starter SNS was discussed with mom to see if flow would get baby interested in staying at the breast. Mom agreed. SNS was set up and cleaning were discussed with mom and grandmother. Baby did latch, had deeper sucks and audible swallows and showed more interest and a longer sustained latch for 10 minutes then she fell asleep. She took 5 cc BM via SNS at the breast. Spoke with mother in regards to baby poor feeding/latch at the breast, current weight loss, and milk supply not being in yet and decision was made to supplement with formula  if needed if EBM not available. Baby relatched to breast and received 5cc Alimentum 20 calorie formula. Infant was satisfied and fell asleep skin to skin with mother after feeding. Talked with mom about how important it is for her to pump 8-12 times a day to establish and maintain milk supply. Discussed case with Asher Muir, RN and mom that there are concerns of infant going home with poor latching and that Dr. Mayford Knife will be consulted in regards to possible holding d/c until feeding more established, mom says she would like to stay and have more assistance with feedings before going home.  Plan written and given to mother:  1. Pump both breast every 2-3 hours for 15 minutes with Double Electric Breast Pump 2. Attempt to latch infant to breast using SNS and pumped Breast milk first 8-12 times/24 hours 3. If not enough pumped breast milk add to SNS Alimentum 20 cal. to equal 15 cc supplement total 4. If infant will not latch to breast, finger feed using SNS15cc EBM withAlimentum if needed) at least 8 times in 24 hours 5. Call [facility] or Nurse for assistance as needed.  Will plan to see for next feed and to teach finger feeding. Asher Muir, RN to call and speak with Dr. Mayford Knife   Patient Name: Brooke Carroll [id number] Date: 08/17/2015     Maternal Data    Feeding Feeding Type: Breast Fed  LATCH Score/Interventions Latch: Too sleepy or reluctant, no latch  achieved, no sucking elicited. Intervention(s): Skin to skin;Teach feeding cues;Waking techniques Intervention(s): Adjust position;Assist with latch;Breast massage;Breast compression  Audible Swallowing: None  Type of Nipple: Everted at rest and after stimulation  Comfort (Breast/Nipple): Soft / non-tender     Hold (Positioning): Assistance needed to correctly position infant at breast and maintain latch.  LATCH Score: 5  Lactation Tools Discussed/Used     Consult Status      Ed Blalock 08/17/2015, 10:53 AM

## 2015-08-17 NOTE — Lactation Note (Signed)
This note was copied from the chart of Brooke Bernece Gall. Lactation Consultation Note  Assisted mom with latch using SNS. Mom pre-pumped with double electric pump and received 5 cc EBM. Took approximately 10- 15 minutes to get infant latched to right breast in the cradle hold. Infant was able to get latched after several attempts with SNS attached. Once obtained deeper latch, She did sustain sucking for 15 minutes with SNS attached taking 5 cc EBM and 10 cc Alimentum. Prior to a deeper latch infant would pull on and off or take a few sucks and fall asleep. Mom is aware of awakening techniques. Mom did well with latching baby to breast with SNS attached, did need help with infant hands as they get in the way. Enc. Mom to pump q 2-3 hours and feed infant at breast using EBM and SNS. Mom is aware of supply and demand and need to stimulate breasts to initiate a supply until infant nursing better at breast. Mom was pleased with how feeding went this time. Enc. Mom to call PRN assistance. Decision was made to have mom and baby stay tonight to continue to work on BF.  Patient Name: Brooke Carroll [id number] Date: 08/17/2015 Reason for consult: Follow-up assessment;Difficult latch   Maternal Data    Feeding Feeding Type: Breast Fed Length of feed: 15 min  LATCH Score/Interventions Latch: Repeated attempts needed to sustain latch, nipple held in mouth throughout feeding, stimulation needed to elicit sucking reflex. Intervention(s): Skin to skin;Waking techniques Intervention(s): Adjust position;Assist with latch;Breast massage  Audible Swallowing: Spontaneous and intermittent Intervention(s): Skin to skin Intervention(s): Skin to skin  Type of Nipple: Everted at rest and after stimulation  Comfort (Breast/Nipple): Filling, red/small blisters or bruises, mild/mod discomfort  Problem noted: Mild/Moderate discomfort Interventions (Mild/moderate discomfort): Hand massage;Hand  expression  Hold (Positioning): Assistance needed to correctly position infant at breast and maintain latch. (Working on BF with SNS) Intervention(s): Support Pillows;Position options  LATCH Score: 7  Lactation Tools Discussed/Used Tools: Supplemental Nutrition System;Pump Breast pump type: Double-Electric Breast Pump Pump Review: Setup, frequency, and cleaning   Consult Status Consult Status: Follow-up Date: 08/17/15 Follow-up type: In-patient    Silas Flood Nieves Chapa 08/17/2015, 1:51 PM

## 2015-08-17 NOTE — Progress Notes (Signed)
Subjective: Postpartum Day 2: Cesarean Delivery Patient reports tolerating PO, + flatus and no problems voiding.    Objective: Vital signs in last 24 hours: Temp:  [98 F (36.7 C)-98.6 F (37 C)] 98.6 F (37 C) (09/10 0655) Pulse Rate:  [53-62] 62 (09/10 0655) Resp:  [16-18] 18 (09/10 0655) BP: (109-117)/(59-67) 110/59 mmHg (09/10 0655) SpO2:  [96 %-97 %] 97 % (09/09 1320)  Physical Exam:  General: alert, cooperative, appears stated age and no distress Lochia: appropriate Uterine Fundus: firm Incision: healing well DVT Evaluation: No evidence of DVT seen on physical exam.   Recent Labs  08/14/15 1600 08/16/15 0628  HGB 12.7 10.6*  HCT 36.4 30.7*    Assessment/Plan: Status post Cesarean section. Doing well postoperatively. Pt may desires d/c today if peds agree  Continue current care.  Tyheem Boughner C 08/17/2015, 8:28 AM

## 2015-08-17 NOTE — Lactation Note (Signed)
This note was copied from the chart of Brooke Carroll. Lactation Consultation Note  Assisted mom with latch using SNS. Infant rooting and would not sustain latch for greater than 10 sucks. On and off breast. Took 7 cc EBM using SNS. Mom attached SNS and is able to latch baby to breast with minimal assistance. Taught mom to finger feed 8 cc formula after BF as infant sleepy and not willing to latch. Taught parents to do suck training using finger with gentle tugging on finger while sucking to assist with extension of tongue. Told to call prn questions/concerns. Moms RN updated of situation.  Patient Name: Brooke Carroll [id number] Date: 08/17/2015 Reason for consult: Follow-up assessment   Maternal Data    Feeding Feeding Type: Breast Fed Length of feed: 15 min  LATCH Score/Interventions Latch: Repeated attempts needed to sustain latch, nipple held in mouth throughout feeding, stimulation needed to elicit sucking reflex. Intervention(s): Skin to skin;Waking techniques Intervention(s): Assist with latch;Breast massage  Audible Swallowing: A few with stimulation Intervention(s): Skin to skin;Hand expression Intervention(s): Skin to skin  Type of Nipple: Everted at rest and after stimulation  Comfort (Breast/Nipple): Filling, red/small blisters or bruises, mild/mod discomfort  Problem noted: Mild/Moderate discomfort (discomfort with latch) Interventions (Mild/moderate discomfort): Hand massage;Hand expression  Hold (Positioning): Assistance needed to correctly position infant at breast and maintain latch. Intervention(s): Breastfeeding basics reviewed;Support Pillows;Skin to skin  LATCH Score: 6  Lactation Tools Discussed/Used Tools: Pump;Supplemental Nutrition System Breast pump type: Double-Electric Breast Pump   Consult Status Consult Status: Follow-up Date: 08/18/15 Follow-up type: In-patient    Silas Flood Tanaysia Bhardwaj 08/17/2015, 4:25 PM

## 2015-08-18 NOTE — Lactation Note (Signed)
This note was copied from the chart of Brooke Carroll. Lactation Consultation Note  Mom attempting to latch baby to breast in cross cradle hold.  Baby is frantic and showing feeding cues.  Baby pops off and on breast crying.  Mom was using SNS but feels like it is no longer needed since breasts are fuller.  After a few minutes I recommended a nipple shield and mom agrees.  It is difficult for baby to latch due to swelling in areola/flat nipple. Instructed on use of 24 mm nipple shield. Baby latched quickly but still coming off crying.  Small amount of milk seen in shield.  Shield filled with formula and baby sustained latch and nursed actively.  Baby finished first side and latched to second breast with ease.  Reviewed plan for discharge.  Instructed on use of double SNS to use if supplementation needed.  Volume parameters given.  Mom knows to pump if baby doesn't feed well or if needed for comfort.  Reviewed engorgement treatment.  Instructed to keep a feeding diary at home.  Answered questions.  Lactation outpatient appointment scheduled for Wednesday 08/21/15.  Patient Name: Brooke Denora Wysocki [id number]'U Date: 08/18/2015 Reason for consult: Follow-up assessment;Difficult latch   Maternal Data    Feeding Feeding Type: Breast Fed Length of feed: 30 min  LATCH Score/Interventions Latch: Grasps breast easily, tongue down, lips flanged, rhythmical sucking. Intervention(s): Skin to skin;Teach feeding cues;Waking techniques Intervention(s): Breast compression;Breast massage;Assist with latch;Adjust position  Audible Swallowing: A few with stimulation Intervention(s): Skin to skin;Hand expression;Alternate breast massage  Type of Nipple: Flat  Comfort (Breast/Nipple): Soft / non-tender     Hold (Positioning): Assistance needed to correctly position infant at breast and maintain latch. Intervention(s): Breastfeeding basics reviewed;Support Pillows;Position options;Skin to skin  LATCH  Score: 7  Lactation Tools Discussed/Used Tools: Nipple Shields Nipple shield size: 24   Consult Status Consult Status: Complete    Huston Foley 08/18/2015, 9:47 AM

## 2015-08-18 NOTE — Progress Notes (Signed)
Subjective: Postpartum Day 3: Cesarean Delivery Patient reports tolerating PO, + flatus, + BM and no problems voiding.    Objective: Vital signs in last 24 hours: Temp:  [97.8 F (36.6 C)-97.9 F (36.6 C)] 97.8 F (36.6 C) (09/11 0717) Pulse Rate:  [61-66] 61 (09/11 0717) Resp:  [16-18] 16 (09/11 0717) BP: (112-120)/(61-71) 112/61 mmHg (09/11 0717) SpO2:  [99 %] 99 % (09/11 0717)  Physical Exam:  General: alert, cooperative, appears stated age and no distress Lochia: appropriate Uterine Fundus: firm Incision: healing well DVT Evaluation: No evidence of DVT seen on physical exam.   Recent Labs  08/16/15 0628  HGB 10.6*  HCT 30.7*    Assessment/Plan: Status post Cesarean section. Doing well postoperatively.  Discharge home with standard precautions and return to clinic in 4-6 weeks.  Brooke Carroll C 08/18/2015, 9:18 AM

## 2015-08-21 ENCOUNTER — Ambulatory Visit (HOSPITAL_COMMUNITY)
Admission: RE | Admit: 2015-08-21 | Payer: Medicaid Other | Source: Ambulatory Visit | Admitting: Obstetrics and Gynecology

## 2017-11-04 DIAGNOSIS — M722 Plantar fascial fibromatosis: Secondary | ICD-10-CM | POA: Diagnosis not present

## 2017-11-04 DIAGNOSIS — G8929 Other chronic pain: Secondary | ICD-10-CM | POA: Diagnosis not present

## 2017-11-04 DIAGNOSIS — M79671 Pain in right foot: Secondary | ICD-10-CM | POA: Diagnosis not present

## 2017-11-10 DIAGNOSIS — Z803 Family history of malignant neoplasm of breast: Secondary | ICD-10-CM | POA: Diagnosis not present

## 2017-11-10 DIAGNOSIS — Z6824 Body mass index (BMI) 24.0-24.9, adult: Secondary | ICD-10-CM | POA: Diagnosis not present

## 2017-11-10 DIAGNOSIS — Z01419 Encounter for gynecological examination (general) (routine) without abnormal findings: Secondary | ICD-10-CM | POA: Diagnosis not present

## 2017-11-25 DIAGNOSIS — Z1231 Encounter for screening mammogram for malignant neoplasm of breast: Secondary | ICD-10-CM | POA: Diagnosis not present

## 2017-12-28 DIAGNOSIS — Z0000 Encounter for general adult medical examination without abnormal findings: Secondary | ICD-10-CM | POA: Diagnosis not present

## 2018-02-08 ENCOUNTER — Ambulatory Visit: Payer: Self-pay

## 2018-02-08 ENCOUNTER — Ambulatory Visit (INDEPENDENT_AMBULATORY_CARE_PROVIDER_SITE_OTHER): Payer: BLUE CROSS/BLUE SHIELD | Admitting: Sports Medicine

## 2018-02-08 ENCOUNTER — Ambulatory Visit: Payer: Self-pay | Admitting: Sports Medicine

## 2018-02-08 ENCOUNTER — Encounter: Payer: Self-pay | Admitting: Sports Medicine

## 2018-02-08 VITALS — BP 116/72 | HR 48 | Ht 65.0 in | Wt 143.2 lb

## 2018-02-08 DIAGNOSIS — M79672 Pain in left foot: Secondary | ICD-10-CM

## 2018-02-08 DIAGNOSIS — L909 Atrophic disorder of skin, unspecified: Secondary | ICD-10-CM

## 2018-02-08 DIAGNOSIS — M722 Plantar fascial fibromatosis: Secondary | ICD-10-CM

## 2018-02-08 NOTE — Patient Instructions (Addendum)
Please perform the exercise program that we have prepared for you and gone over in detail on a daily basis.  In addition to the handout you were provided you can access your program through: [email or web address]   Your unique program code is: [email or web address]   You can also look into CEP compression socks / plantar fasciitis sleeves.    Look into having your insurance company cover a set of custom orthotics.  The code is L3030 and there are 2 units.  You can call them  and ask if this is covered.  I am happy to do these for you at any time, you just need to let our front office schedulers know you would like an "orthotic appointment."

## 2018-02-08 NOTE — Procedures (Signed)

## 2018-02-08 NOTE — Procedures (Signed)
LIMITED MSK ULTRASOUND OF LEFT HEEL Images were obtained and interpreted by myself, Gaspar BiddingMichael Nykeria Mealing, DO  Images have been saved and stored to PACS system. Images obtained on: GE S7 Ultrasound machine  FINDINGS:   Thickened plantar fascia measuring up to 0.52 cm in diameter  There appears to be a linear split through the fat pad consistent with iatrogenic injury  Longitudinal arch slightly enlarged with normal fusiform appearance  IMPRESSION:  1. Chronic mild plantar fasciitis 2. Heel pad split, likely iatrogenic

## 2018-02-08 NOTE — Progress Notes (Signed)
Veverly FellsMichael D. Delorise Shinerigby, DO  [facility] [place] [phone]  Eliezer LoftsCarrie Shells - 37 y.o. female MRN [id number]  Date of birth: 08-Sep-1981  Visit Date: 02/08/2018  PCP: Salli RealSun, Yun, MD   Referred by: No ref. provider found   Scribe for today's visit: Stevenson ClinchBrandy Coleman, CMA     SUBJECTIVE:  Eliezer LoftsCarrie Regula is here for New Patient (Initial Visit) (foot pain)  Her L foot apin symptoms INITIALLY: Began about 8 years ago and has been pretty constant over this time. Pain started while working 12 hour days at [employer]. She also injured foot on a trail run several years ago. 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  Described as moderate bruising, shooting and stabbing pain, radiating to the leg and knee. At times pain will shoot up to hip Worsened with weight bearing.  Improved with flip flops, inserts, lying or sitting.  Additional associated symptoms include: Pt reports that pain is similar to standing on a rock. Pain is not worse in the mornings, mostly when weight bearing.     At this time symptoms show no change compared to onset  She tried wearing better shoes and orthotic inserts with minimal relief. She has tried stretching and using a foam roller. She has tried wearing compression sleeve but then developed pain on the lateral aspect of her foot.  She has tried steroids, mobic, and prednisone with minima relief. She has been taking Advil daily.   [facility].    ROS Denies night time disturbances. Denies fevers, chills, or night sweats. Denies unexplained weight loss. Denies personal history of cancer. Denies changes in bowel or bladder habits. Reports recent unreported falls. Denies new or worsening dyspnea or wheezing. Denies headaches or dizziness.  Reports numbness in both feet.  Denies dizziness or presyncopal episodes Denies lower extremity edema    HISTORY & PERTINENT PRIOR DATA:    Prior History reviewed and updated per electronic medical record.  Significant history, findings, studies and interim changes include:  reports that  has never smoked. she has never used smokeless tobacco. No results for input(s): HGBA1C, LABURIC, CREATINE in the last 8760 hours. No specialty comments available. No problems updated.  OBJECTIVE:  VS:  HT:5\' 5"  (165.1 cm)   WT:143 lb 3.2 oz (65 kg)  BMI:23.83    BP:116/72  HR:(!) 48bpm  TEMP: ( )  RESP:99 %   PHYSICAL EXAM: Constitutional: WDWN, Non-toxic appearing. Psychiatric: Alert & appropriately interactive.  Not depressed or anxious appearing. Respiratory: No increased work of breathing.  Trachea Midline Eyes: Pupils are equal.  EOM intact without nystagmus.  No scleral icterus  NEUROVASCULAR exam: No clubbing or cyanosis appreciated No significant venous stasis changes Capillary Refill: normal, less than 2 seconds   Bilateral lower extremities overall well aligned with a high cavus foot.  She has marked pain with palpation of the left heel along the posterior aspect more so than at the origin of the plantar fascia although this is painful as well.  Small amount of pain with Achilles squeeze.  Dorsiflexion to 100 degrees.  Intrinsic ankle strength is 5/5 in all 4 planes.  She does have collapse of the longitudinal arch with weightbearing and poor posterior tibialis recruitment.   ASSESSMENT & PLAN:   1. Pain of left heel   2. Plantar fascia syndrome   3. Plantar fat pad atrophy     Orders & Meds:  Orders Placed This Encounter  Procedures  . [place] MSK POCT ULTRASOUND   No orders of the defined types were placed in this encounter.    PLAN: Discussed the foundation of treatment for this condition is physical therapy and/or daily (5-6 days/week) therapeutic exercises, focusing on core strengthening, coordination, neuromuscular control/reeducation.  Therapeutic exercises prescribed per procedure note. [place] exercises  with additional home exercises reviewed per procedure note Longitudinal arch support recommended begin with over-the-counter options.  Given how long this is been going on for the significant alterations in her gait and constant daily pain with underlying abnormalities of her foot and ankle she would benefit from custom cushioned insoles and this was discussed with her today.  She will call her insurance to check in if she would like to proceed with this will call to schedule a separate orthotic visit. Frequent icing Appropriate activity modifications excellent 6-week follow-up for clinical reevaluation and consideration of further advanced imaging.  Follow-up: Return in about 6 weeks (around 03/22/2018) for regular f/u and then schedule separate orthotic appt at your convenience.   Pertinent documentation may be included in additional procedure notes, imaging studies, problem based documentation and patient instructions. Please see these sections of the encounter for additional information regarding this visit. CMA/ATC served as Neurosurgeon during this visit. History, Physical, and Plan performed by medical provider. Documentation and orders reviewed and attested to.      [name], DO    [facility] Physician

## 2018-02-11 ENCOUNTER — Ambulatory Visit: Payer: BLUE CROSS/BLUE SHIELD | Admitting: Sports Medicine

## 2018-02-11 ENCOUNTER — Encounter: Payer: Self-pay | Admitting: Sports Medicine

## 2018-02-11 VITALS — BP 124/72 | HR 56

## 2018-02-11 DIAGNOSIS — L909 Atrophic disorder of skin, unspecified: Secondary | ICD-10-CM | POA: Diagnosis not present

## 2018-02-11 DIAGNOSIS — M722 Plantar fascial fibromatosis: Secondary | ICD-10-CM | POA: Diagnosis not present

## 2018-02-11 DIAGNOSIS — M79672 Pain in left foot: Secondary | ICD-10-CM

## 2018-02-11 DIAGNOSIS — R269 Unspecified abnormalities of gait and mobility: Secondary | ICD-10-CM | POA: Diagnosis not present

## 2018-02-11 NOTE — Progress Notes (Signed)
[name]. [name] Sports Medicine [facility] at [facility] [phone]  Brooke Carroll - 37 y.o. female MRN [id number]  Date of birth: Jan 03, 1981  Visit Date: 02/11/2018  PCP: Salli Real, MD   Referred by: Salli Real, MD   Scribe for today's visit: Christoper Fabian, LAT, ATC     SUBJECTIVE:  Brooke Carroll is here for Follow-up (orthotics for L heel pain) .   Notes from initial visit on 02/08/18: Her L foot pain symptoms INITIALLY: Began about 8 years ago and has been pretty constant over this time. Pain started while working 12 hour days at [employer]. She also injured foot on a trail run several years ago. 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  Described as moderate bruising, shooting and stabbing pain, radiating to the leg and knee. At times pain will shoot up to hip Worsened with weight bearing.  Improved with flip flops, inserts, lying or sitting.  Additional associated symptoms include: Pt reports that pain is similar to standing on a rock. Pain is not worse in the mornings, mostly when weight bearing.     At this time symptoms show no change compared to onset  She tried wearing better shoes and orthotic inserts with minimal relief. She has tried stretching and using a foam roller. She has tried wearing compression sleeve but then developed pain on the lateral aspect of her foot.  She has tried steroids, mobic, and prednisone with minima relief. She has been taking Advil daily.   Recent xray at [facility] 3-4 months ago.    Compared to the last office visit on 02/08/18, her previously described L foot/heel pain symptoms show no change. Current symptoms are moderate & are radiating to L knee and lower leg. Here today for custom FASTEC Orthotics   ROS  See prior notes   HISTORY & PERTINENT PRIOR DATA:  Prior History reviewed and updated per electronic medical record.  Significant history, findings, studies and interim changes  include:  reports that  has never smoked. she has never used smokeless tobacco. No results for input(s): HGBA1C, LABURIC, CREATINE in the last 8760 hours. No specialty comments available. Problem  Plantar Fascia Syndrome  Plantar Fat Pad Atrophy    OBJECTIVE:  VS:  HT:    WT:   BMI:     BP:124/72  HR:(!) 56bpm  TEMP: ( )  RESP:98 %   PHYSICAL EXAM: Constitutional: WDWN, Non-toxic appearing. Psychiatric: Alert & appropriately interactive.  Not depressed or anxious appearing. Respiratory: No increased work of breathing.  Trachea Midline Eyes: Pupils are equal.  EOM intact without nystagmus.  No scleral icterus  NEUROVASCULAR exam: No clubbing or cyanosis appreciated No significant venous stasis changes Capillary Refill: normal, less than 2 seconds   Left foot with moderate collapse of the longitudinal arch compared to the right.  She has normal recruitment otherwise.  She does have slight over pronation of his foot with ambulation.  Otherwise please see prior notes.   ASSESSMENT & PLAN:   1. Pain of left heel   2. Plantar fascia syndrome   3. Gait disturbance   4. Plantar fat pad atrophy    ++++++++++++++++++++++++++++++++++++++++++++ Orders & Meds: No orders of the defined types were placed in this encounter.  No orders of the defined types were placed in this encounter.   ++++++++++++++++++++++++++++++++++++++++++++ PLAN: Custom FASTEC insoles fabricated today per procedure note  Follow-up: Return for as scheduled.  Pertinent documentation may be included in additional procedure notes, imaging studies, problem based documentation and patient instructions. Please see these sections of the encounter for additional information regarding this visit. CMA/ATC served as Neurosurgeonscribe during this visit. History, Physical, and Plan performed by medical provider. Documentation and orders reviewed and attested to.      Andrena MewsMichael D Chattie Greeson, DO    [facility] Physician

## 2018-02-11 NOTE — Procedures (Signed)
PROCEDURE: CUSTOM ORTHOTIC FABRICATION Patient's underlying musculoskeletal conditions are directly related to poor biomechanics and will benefit from a functional custom orthotic.  There are no significant foot deformities that complicate the use of a custom orthotic.  The patient was fitted for a standard, cushioned, semi-rigid orthotic. The orthotic was heated & placed on the orthotic stand. The patient was positioned in subtalar neutral position and 10 of ankle dorsiflexion and weight bearing stance on the heated orthotic blank. After completion of the molding a base was applied to the orthotic blank. The orthotic was ground to a stable position for weightbearing. The patient ambulated in these and reported they were comfortable without pressure spots.              BLANK:  Size 8 - Standard Cushioned                 BASE:  Blue EVA      POSTINGS:  n/a  

## 2018-02-16 ENCOUNTER — Encounter: Payer: Self-pay | Admitting: Sports Medicine

## 2018-02-17 ENCOUNTER — Encounter: Payer: Self-pay | Admitting: Physical Therapy

## 2018-03-21 ENCOUNTER — Ambulatory Visit (INDEPENDENT_AMBULATORY_CARE_PROVIDER_SITE_OTHER): Payer: BLUE CROSS/BLUE SHIELD | Admitting: Sports Medicine

## 2018-03-21 ENCOUNTER — Encounter: Payer: Self-pay | Admitting: Sports Medicine

## 2018-03-21 VITALS — BP 104/72 | HR 54 | Ht 65.0 in | Wt 150.2 lb

## 2018-03-21 DIAGNOSIS — R269 Unspecified abnormalities of gait and mobility: Secondary | ICD-10-CM | POA: Diagnosis not present

## 2018-03-21 DIAGNOSIS — M792 Neuralgia and neuritis, unspecified: Secondary | ICD-10-CM | POA: Diagnosis not present

## 2018-03-21 DIAGNOSIS — M722 Plantar fascial fibromatosis: Secondary | ICD-10-CM

## 2018-03-21 DIAGNOSIS — M79672 Pain in left foot: Secondary | ICD-10-CM | POA: Diagnosis not present

## 2018-03-21 MED ORDER — GABAPENTIN 300 MG PO CAPS: ORAL_CAPSULE | ORAL | 1 refills | Status: DC

## 2018-03-21 NOTE — Progress Notes (Signed)
Brooke Carroll. Brooke Carroll Sports Medicine [facility] at [facility] [phone]  Brooke Carroll - 37 y.o. female MRN [id number]  Date of birth: 10-03-1981  Visit Date: 03/21/2018  PCP: Salli Real, MD   Referred by: Salli Real, MD  Scribe for today's visit: Christoper Fabian, LAT, ATC     SUBJECTIVE:  Brooke Carroll is here for Follow-up (L heel pain and plantar fasciitis) .   Her L foot apin symptoms INITIALLY: Began about 8 years ago and has been pretty constant over this time. Pain started while working 12 hour days at [employer]. She also injured foot on a trail run several years ago. 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  Described as moderate bruising, shooting and stabbing pain, radiating to the leg and knee. At times pain will shoot up to hip Worsened with weight bearing.  Improved with flip flops, inserts, lying or sitting.  Additional associated symptoms include: Pt reports that pain is similar to standing on a rock. Pain is not worse in the mornings, mostly when weight bearing.     At this time symptoms show no change compared to onset  She tried wearing better shoes and orthotic inserts with minimal relief. She has tried stretching and using a foam roller. She has tried wearing compression sleeve but then developed pain on the lateral aspect of her foot.  She has tried steroids, mobic, and prednisone with minima relief. She has been taking Advil daily.   Recent xray at [facility] 3-4 months ago.   03/21/18: Compared to the last office visit on 02/11/18, her previously described L heel pain and plantar fasciitis symptoms are worsening w/ new locations of pain noted on her L heel, now located laterally and at the post aspect of the L calcaneus in addition to the previous proximal, medial location. Current symptoms are an 8/10 & are radiating to L plantar fascia. She has been wearing her orthotics and doing her Alfredson's exercises.  She has  tried steroids, Mobic and Advil and is currently taking Advil q 6 hours.  ROS Denies night time disturbances. Denies fevers, chills, or night sweats. Denies unexplained weight loss. Denies personal history of cancer. Denies changes in bowel or bladder habits. Denies recent unreported falls. Denies new or worsening dyspnea or wheezing. Denies headaches or dizziness.  Denies numbness, tingling or weakness  In the extremities.  Denies dizziness or presyncopal episodes Denies lower extremity edema    HISTORY & PERTINENT PRIOR DATA:  Prior History reviewed and updated per electronic medical record.  Significant/pertinent history, findings, studies include:  reports that she has never smoked. She has never used smokeless tobacco. No results for input(s): HGBA1C, LABURIC, CREATINE in the last 8760 hours. No specialty comments available. No problems updated.  OBJECTIVE:  VS:  HT:5\' 5"  (165.1 cm)   WT:150 lb 3.2 oz (68.1 kg)  BMI:24.99    BP:104/72  HR:(Abnormal) 54bpm  TEMP: ( )  RESP:98 %   PHYSICAL EXAM: Constitutional: WDWN, Non-toxic appearing. Psychiatric: Alert & appropriately interactive.  Not depressed or anxious appearing. Respiratory: No increased work of breathing.  Trachea Midline Eyes: Pupils are equal.  EOM intact without nystagmus.  No scleral icterus  Vascular Exam: warm to touch no edema  lower extremity neuro exam: unremarkable  MSK Exam: Persistent pain over the origin of the plantar fascia but this is mild.  She has a stable ankle exam otherwise.  She does have a  small amount of numbness and tingling with Tinel's over the posterior aspect of the ankle.  Pain is more lateral in nature than medial.  Minimal pain with calcaneal squeeze.  ASSESSMENT & PLAN:   1. Neuralgia and neuritis   2. Pain of left heel   3. Plantar fascia syndrome   4. Gait disturbance     PLAN: Ultimately she is continuing to have persistent symptoms that is actually worsened in  nature.  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  She should continue with the Alfredson exercises compression and can try over-the-counter capsaicin cream.  If any persistent or ongoing/worsening symptoms further diagnostic evaluation with MRI of the foot and ankle could be considered versus diagnostic/therapeutic right plantar nerve block.  Follow-up: Return in about 6 weeks (around 05/02/2018).      Please see additional documentation for Objective, Assessment and Plan sections. Pertinent additional documentation may be included in corresponding procedure notes, imaging studies, problem based documentation and patient instructions. Please see these sections of the encounter for additional information regarding this visit.  CMA/ATC served as Neurosurgeonscribe during this visit. History, Physical, and Plan performed by medical provider. Documentation and orders reviewed and attested to.      Andrena MewsMichael D Rigby, DO    [facility] Physician

## 2018-03-30 ENCOUNTER — Telehealth: Payer: Self-pay | Admitting: Sports Medicine

## 2018-03-30 ENCOUNTER — Other Ambulatory Visit: Payer: Self-pay | Admitting: Physical Therapy

## 2018-03-30 DIAGNOSIS — G8929 Other chronic pain: Secondary | ICD-10-CM

## 2018-03-30 DIAGNOSIS — M722 Plantar fascial fibromatosis: Secondary | ICD-10-CM

## 2018-03-30 DIAGNOSIS — M79672 Pain in left foot: Principal | ICD-10-CM

## 2018-03-30 NOTE — Telephone Encounter (Signed)
Copied from CRM [phone]. Topic: Quick Communication - See Telephone Encounter >> Mar 30, 2018  8:10 AM Cipriano BunkerLambe, Annette S wrote: CRM for notification.   Pt. Called and said she is not getting better.  She said doctor spoke of MRI and asking if she needs to come back in or if he would put referral in.  Please call pt. And let her know her next step.  Gabapentin helped with pain but still underline issue.   See Telephone encounter for: 03/30/18.

## 2018-03-30 NOTE — Telephone Encounter (Signed)
MRI of L ankle ordered.  Called pt and LM regarding ordering of the MRI and informed her that if she hasn't heard [facility] by early next week to give us a call so we can follow-up.

## 2018-04-05 ENCOUNTER — Telehealth: Payer: Self-pay | Admitting: Sports Medicine

## 2018-04-05 ENCOUNTER — Ambulatory Visit
Admission: RE | Admit: 2018-04-05 | Discharge: 2018-04-05 | Disposition: A | Discharge status: Home / Self Care | Payer: BLUE CROSS/BLUE SHIELD | Source: Ambulatory Visit | Attending: Sports Medicine | Admitting: Sports Medicine

## 2018-04-05 DIAGNOSIS — M722 Plantar fascial fibromatosis: Secondary | ICD-10-CM

## 2018-04-05 DIAGNOSIS — M79672 Pain in left foot: Principal | ICD-10-CM

## 2018-04-05 DIAGNOSIS — G8929 Other chronic pain: Secondary | ICD-10-CM

## 2018-04-05 DIAGNOSIS — N951 Menopausal and female climacteric states: Secondary | ICD-10-CM | POA: Diagnosis not present

## 2018-04-05 DIAGNOSIS — S86312A Strain of muscle(s) and tendon(s) of peroneal muscle group at lower leg level, left leg, initial encounter: Secondary | ICD-10-CM | POA: Diagnosis not present

## 2018-04-05 DIAGNOSIS — R635 Abnormal weight gain: Secondary | ICD-10-CM | POA: Diagnosis not present

## 2018-04-05 NOTE — Telephone Encounter (Signed)
Copied from CRM [id number]. Topic: Quick Communication - See Telephone Encounter >> Mar 30, 2018  8:10 AM Cipriano Bunker wrote: CRM for notification.   Pt. Called and said she is not getting better.  She said doctor spoke of MRI and asking if she needs to come back in or if he would put referral in. Please call pt. And let her know her next step.  Gabapentin helped with pain but still underline issue.   See Telephone encounter for: 03/30/18. >> Apr 05, 2018  4:48 PM Rudi Coco, NT wrote: Pt. Calling back about her MRI results

## 2018-04-05 NOTE — Telephone Encounter (Signed)
Pt would like a call back about MRI results  °

## 2018-04-05 NOTE — Progress Notes (Signed)
Consistent with Plantar Fasciitis.  Low likelihood of the small split causing the majority of her symptoms given the location but would explain some of the lateral pain. Okay to follow up to discuss further options including injection if she would like.  I'm happy to send her to Dr. Lajoyce Corners for a second opinion if she would like

## 2018-04-06 DIAGNOSIS — R4586 Emotional lability: Secondary | ICD-10-CM | POA: Diagnosis not present

## 2018-04-06 DIAGNOSIS — G479 Sleep disorder, unspecified: Secondary | ICD-10-CM | POA: Diagnosis not present

## 2018-04-06 DIAGNOSIS — Z1331 Encounter for screening for depression: Secondary | ICD-10-CM | POA: Diagnosis not present

## 2018-04-06 DIAGNOSIS — Z1339 Encounter for screening examination for other mental health and behavioral disorders: Secondary | ICD-10-CM | POA: Diagnosis not present

## 2018-04-06 DIAGNOSIS — R5383 Other fatigue: Secondary | ICD-10-CM | POA: Diagnosis not present

## 2018-04-06 NOTE — Telephone Encounter (Signed)
Pt aware of results & recommendations 

## 2018-04-11 DIAGNOSIS — E663 Overweight: Secondary | ICD-10-CM | POA: Diagnosis not present

## 2018-04-12 ENCOUNTER — Ambulatory Visit: Payer: BLUE CROSS/BLUE SHIELD | Admitting: Sports Medicine

## 2018-04-15 ENCOUNTER — Encounter: Payer: Self-pay | Admitting: Sports Medicine

## 2018-04-18 ENCOUNTER — Ambulatory Visit: Payer: BLUE CROSS/BLUE SHIELD | Admitting: Sports Medicine

## 2018-04-18 VITALS — BP 104/72 | HR 72 | Ht 65.0 in | Wt 150.0 lb

## 2018-04-18 DIAGNOSIS — M79672 Pain in left foot: Secondary | ICD-10-CM

## 2018-04-18 DIAGNOSIS — M792 Neuralgia and neuritis, unspecified: Secondary | ICD-10-CM | POA: Diagnosis not present

## 2018-04-18 DIAGNOSIS — M722 Plantar fascial fibromatosis: Secondary | ICD-10-CM

## 2018-04-18 DIAGNOSIS — G8929 Other chronic pain: Secondary | ICD-10-CM

## 2018-04-18 DIAGNOSIS — Z87898 Personal history of other specified conditions: Secondary | ICD-10-CM | POA: Diagnosis not present

## 2018-04-18 MED ORDER — NITROGLYCERIN 0.2 MG/HR TD PT24: MEDICATED_PATCH | TRANSDERMAL | 1 refills | Status: DC

## 2018-04-18 MED ORDER — IBANDRONATE SODIUM 150 MG PO TABS: ORAL_TABLET | ORAL | 0 refills | Status: DC

## 2018-04-18 NOTE — Progress Notes (Signed)
Brooke Carroll. Brooke Carroll Sports Medicine [facility] at [facility] [phone]  Brooke Carroll - 37 y.o. female MRN [id number]  Date of birth: 09-Mar-1981  Visit Date: 04/18/2018  PCP: Salli Real, MD   Referred by: Salli Real, MD  Scribe for today's visit: Stevenson Clinch, CMA     SUBJECTIVE:  Brooke Carroll is here for Follow-up (neuralgia and neuritis)  Her L foot apin symptoms INITIALLY: Began about 8 years ago and has been pretty constant over this time. Pain started while working 12 hour days at [employer]. She also injured foot on a trail run several years ago. 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  Described as moderate bruising, shooting and stabbing pain, radiating to the leg and knee. At times pain will shoot up to hip Worsened with weight bearing.  Improved with flip flops, inserts, lying or sitting.  Additional associated symptoms include: Pt reports that pain is similar to standing on a rock. Pain is not worse in the mornings, mostly when weight bearing.     At this time symptoms show no change compared to onset  She tried wearing better shoes and orthotic inserts with minimal relief. She has tried stretching and using a foam roller. She has tried wearing compression sleeve but then developed pain on the lateral aspect of her foot.  She has tried steroids, mobic, and prednisone with minima relief. She has been taking Advil daily.   Recent xray at [facility] 3-4 months ago.   03/21/18: Compared to the last office visit on 02/11/18, her previously described L heel pain and plantar fasciitis symptoms are worsening w/ new locations of pain noted on her L heel, now located laterally and at the post aspect of the L calcaneus in addition to the previous proximal, medial location. Current symptoms are an 8/10 & are radiating to L plantar fascia. She has been wearing her orthotics and doing her Alfredson's exercises.  She has tried steroids,  Mobic and Advil and is currently taking Advil q 6 hours.  04/18/2018: Compared to the last office visit, her previously described symptoms show no change. She has stopped running d/t pain.  Current symptoms are moderate (6/10) & are radiating to L plantar fascia.  She has been wearing her orthotics and doing her Alfredson's exercises.  She has tried steroids, Mobic and Advil and is currently taking Advil q 6 hours. She was prescribed Gabapentin but it didn't seem to make a difference.   ROS Denies night time disturbances. Denies fevers, chills, or night sweats. Denies unexplained weight loss. Denies personal history of cancer. Denies changes in bowel or bladder habits. Denies recent unreported falls. Denies new or worsening dyspnea or wheezing. Denies headaches or dizziness.  Denies numbness, tingling or weakness  In the extremities.  Denies dizziness or presyncopal episodes Denies lower extremity edema    HISTORY & PERTINENT PRIOR DATA:  Prior History reviewed and updated per electronic medical record.  Significant/pertinent history, findings, studies include:  reports that she has never smoked. She has never used smokeless tobacco. No results for input(s): HGBA1C, LABURIC, CREATINE in the last 8760 hours. No specialty comments available. No problems updated.  OBJECTIVE:  VS:  HT:5\' 5"  (165.1 cm)   WT:150 lb (68 kg)  BMI:24.96    BP:104/72  HR:72bpm  TEMP: ( )  RESP:97 %   PHYSICAL EXAM: Constitutional: WDWN, Non-toxic appearing. Psychiatric: Alert & appropriately interactive.  Not depressed or anxious  appearing. Respiratory: No increased work of breathing.  Trachea Midline Eyes: Pupils are equal.  EOM intact without nystagmus.  No scleral icterus  Vascular Exam: warm to touch no edema  lower extremity neuro exam: unremarkable  MSK Exam: TTP over the plantar heel pad Mid arch strain  MRI reviewed    ASSESSMENT & PLAN:   1. Chronic heel pain, left   2.  Plantar fasciitis of left foot   3. Neuralgia and neuritis   4. Pain of left heel     PLAN: Will plan to start nitro protocol for tendon changes Boniva for stress response in calcaneous. Boot X 4 weeks  Follow-up: Return in about 1 month (around 05/16/2018).      Please see additional documentation for Objective, Assessment and Plan sections. Pertinent additional documentation may be included in corresponding procedure notes, imaging studies, problem based documentation and patient instructions. Please see these sections of the encounter for additional information regarding this visit.  CMA/ATC served as Neurosurgeon during this visit. History, Physical, and Plan performed by medical provider. Documentation and orders reviewed and attested to.      Brooke Mews, DO    [facility] Physician

## 2018-04-18 NOTE — Progress Notes (Signed)
PROCEDURE NOTE : OSTEOPATHIC MANIPULATION The decision today to treat with Osteopathic Manipulative Therapy (OMT) was based on physical exam findings. Verbal consent was obtained following a discussion with the patient regarding the of risks, benefits and potential side effects, including an acute pain flare,post manipulation soreness and need for repeat treatments.     Contraindications to OMT reviewed and include: NONE  Manipulation was performed as below: Regions treated: Lower extremities OMT Techniques Used: HVLA, muscle energy and myofascial release  The patient tolerated the treatment well and reported Improved symptoms following treatment today. Patient was given medications, exercises, stretches and lifestyle modifications per AVS and verbally.   OSTEOPATHIC/STRUCTURAL EXAM:   Left foot hyper plantar flexed Posterior fibular head

## 2018-04-18 NOTE — Patient Instructions (Addendum)
We will call in Boniva for you later today When you are on your feet wear the boot for the next 4 weeks. Get the waffle heel cups and use these in the boot and your regular shoes when you get back  Try icing her foot or regular basis 2-3 times per day.  Daily therapeutic exercises.  You are continuing to have pain at the end of the 4 weeks we will plan to refer you to physical therapy at that time.  Nitroglycerin Protocol   Apply 1/4 nitroglycerin patch to affected area daily.  Change position of patch within the affected area every 24 hours.  You may experience a headache during the first 1-2 weeks of using the patch, these should subside.  If you experience headaches after beginning nitroglycerin patch treatment, you may take your preferred over the counter pain reliever.  Another side effect of the nitroglycerin patch is skin irritation or rash related to patch adhesive.  Please notify our office if you develop more severe headaches or rash, and stop the patch.  Tendon healing with nitroglycerin patch may require 12 to 24 weeks depending on the extent of injury.  Men should not use if taking Viagra, Cialis, or Levitra.   Do not use if you have migraines or rosacea.

## 2018-04-23 ENCOUNTER — Encounter: Payer: Self-pay | Admitting: Sports Medicine

## 2018-04-28 ENCOUNTER — Ambulatory Visit: Payer: BLUE CROSS/BLUE SHIELD | Admitting: Sports Medicine

## 2018-05-02 DIAGNOSIS — S62609A Fracture of unspecified phalanx of unspecified finger, initial encounter for closed fracture: Secondary | ICD-10-CM | POA: Diagnosis not present

## 2018-05-02 DIAGNOSIS — M79645 Pain in left finger(s): Secondary | ICD-10-CM | POA: Diagnosis not present

## 2018-05-20 ENCOUNTER — Ambulatory Visit: Payer: BLUE CROSS/BLUE SHIELD | Admitting: Sports Medicine

## 2018-05-23 ENCOUNTER — Encounter: Payer: Self-pay | Admitting: Sports Medicine

## 2018-05-23 ENCOUNTER — Ambulatory Visit: Payer: Self-pay

## 2018-05-23 ENCOUNTER — Ambulatory Visit: Payer: BLUE CROSS/BLUE SHIELD | Admitting: Sports Medicine

## 2018-05-23 VITALS — BP 102/72 | HR 59 | Ht 65.0 in | Wt 156.0 lb

## 2018-05-23 DIAGNOSIS — M79672 Pain in left foot: Secondary | ICD-10-CM

## 2018-05-23 DIAGNOSIS — M792 Neuralgia and neuritis, unspecified: Secondary | ICD-10-CM | POA: Diagnosis not present

## 2018-05-23 DIAGNOSIS — G8929 Other chronic pain: Secondary | ICD-10-CM | POA: Diagnosis not present

## 2018-05-23 DIAGNOSIS — M722 Plantar fascial fibromatosis: Secondary | ICD-10-CM

## 2018-05-23 MED ORDER — IBUPROFEN-FAMOTIDINE 800-26.6 MG PO TABS: 1.0000 | ORAL_TABLET | Freq: Three times a day (TID) | ORAL | 0 refills | Status: AC | PRN

## 2018-05-23 NOTE — Procedures (Signed)
PROCEDURE NOTE:  Ultrasound Guided: Injection: Left plantar fascia Images were obtained and interpreted by myself, Gaspar BiddingMichael Rigby, DO  Images have been saved and stored to PACS system. Images obtained on: GE S7 Ultrasound machine    ULTRASOUND FINDINGS:  markedly thick PF with cortical changes  DESCRIPTION OF PROCEDURE:  The patient's clinical condition is marked by substantial pain and/or significant functional disability. Other conservative therapy has not provided relief, is contraindicated, or not appropriate. There is a reasonable likelihood that injection will significantly improve the patient's pain and/or functional impairment.   After discussing the risks, benefits and expected outcomes of the injection and all questions were reviewed and answered, the patient wished to undergo the above named procedure.  Verbal consent was obtained.  The ultrasound was used to identify the target structure and adjacent neurovascular structures. The skin was then prepped in sterile fashion and the target structure was injected under direct visualization using sterile technique as below:  Single injection performed as below: PREP: Alcohol and Ethel Chloride APPROACH:medial direct, single injection, 22g 1.5 in. INJECTATE: 1 cc 1% lidocaine, 1 cc 0.5% Marcaine and 1 cc 40mg /mL DepoMedrol ASPIRATE: None DRESSING: Band-Aid  Post procedural instructions including recommending icing and warning signs for infection were reviewed.    This procedure was well tolerated and there were no complications.   IMPRESSION: Succesful Ultrasound Guided: Injection

## 2018-05-23 NOTE — Patient Instructions (Signed)

## 2018-05-23 NOTE — Progress Notes (Signed)
Brooke Carroll. Brooke Carroll Sports Medicine [facility] at [facility] [phone]  Brooke Carroll - 37 y.o. female MRN [id number]  Date of birth: Oct 12, 1981  Visit Date: 05/23/2018  PCP: Salli Real, MD   Referred by: Salli Real, MD  Scribe(s) for today's visit: Christoper Fabian, LAT, ATC  SUBJECTIVE:  Brooke Carroll is here for Follow-up (L heel pain) .    Her L foot apin symptoms INITIALLY: Began about 8 years ago and has been pretty constant over this time. Pain started while working 12 hour days at [employer]. She also injured foot on a trail run several years ago. 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  Described as moderate bruising, shooting and stabbing pain, radiating to the leg and knee. At times pain will shoot up to hip Worsened with weight bearing.  Improved with flip flops, inserts, lying or sitting.  Additional associated symptoms include: Pt reports that pain is similar to standing on a rock. Pain is not worse in the mornings, mostly when weight bearing.     At this time symptoms show no change compared to onset  She tried wearing better shoes and orthotic inserts with minimal relief. She has tried stretching and using a foam roller. She has tried wearing compression sleeve but then developed pain on the lateral aspect of her foot.  She has tried steroids, mobic, and prednisone with minima relief. She has been taking Advil daily.   Recent xray at [facility] 3-4 months ago.   03/21/18: Compared to the last office visit on 02/11/18, her previously described L heel pain and plantar fasciitis symptoms are worsening w/ new locations of pain noted on her L heel, now located laterally and at the post aspect of the L calcaneus in addition to the previous proximal, medial location. Current symptoms are an 8/10 & are radiating to L plantar fascia. She has been wearing her orthotics and doing her Alfredson's exercises.  She has tried steroids,  Mobic and Advil and is currently taking Advil q 6 hours.  04/18/2018: Compared to the last office visit, her previously described symptoms show no change. She has stopped running d/t pain.  Current symptoms are moderate (6/10) & are radiating to L plantar fascia.  She has been wearing her orthotics and doing her Alfredson's exercises.  She has tried steroids, Mobic and Advil and is currently taking Advil q 6 hours. She was prescribed Gabapentin but it didn't seem to make a difference.   05/23/2018: Compared to the last office visit on 04/18/18, her previously described L heel pain symptoms are improving but notes very slow improvement.  She reports no pain at rest and 1-2/10 pain when walking in the boot.  When she tries to walk out of the boot, she reports a sensation of her plantar fascia tearing. Current symptoms are mild-mod depending on whether she's in the boot or not & are radiating to L plantar fascia. She has been wearing a walking boot x one month.  She's been taking Boniva and using the nitroglycerin patches.  She has tried Gabapentin, steroids and Mobic and also takes Advil.  She has been doing Alfredson exercises as well.   REVIEW OF SYSTEMS: Denies night time disturbances. Denies fevers, chills, or night sweats. Denies unexplained weight loss. Denies personal history of cancer. Denies changes in bowel or bladder habits. Denies recent unreported falls. Denies new or worsening dyspnea or wheezing. Denies headaches or dizziness.  Reports numbness, tingling or weakness  In the extremities - in L lateral foot and toes 4 and 5 Denies dizziness or presyncopal episodes Denies lower extremity edema    HISTORY & PERTINENT PRIOR DATA:  Prior History reviewed and updated per electronic medical record.  Significant/pertinent history, findings, studies include:  reports that she has never smoked. She has never used smokeless tobacco. No results for input(s): HGBA1C, LABURIC, CREATINE in  the last 8760 hours. No specialty comments available. No problems updated.  OBJECTIVE:  VS:  HT:5\' 5"  (165.1 cm)   WT:156 lb (70.8 kg)  BMI:25.96    BP:102/72  HR:(Abnormal) 59bpm  TEMP: ( )  RESP:97 %   PHYSICAL EXAM: Constitutional: WDWN, Non-toxic appearing. Psychiatric: Alert & appropriately interactive.  Not depressed or anxious appearing. Respiratory: No increased work of breathing.  Trachea Midline Eyes: Pupils are equal.  EOM intact without nystagmus.  No scleral icterus  Vascular Exam: warm to touch no edema  lower extremity neuro exam: unremarkable normal strength normal sensation  MSK Exam: Left heel has marked pain with palpation of the plantar fascial origin.  No significant pain with palpation of the longitudinal arch.  Minimal pain over the lateral foot and over the peroneal tendons.     ASSESSMENT & PLAN:   1. Chronic heel pain, left   2. Plantar fasciitis of left foot   3. Neuralgia and neuritis   4. Pain of left heel   5. Plantar fascia syndrome     PLAN: Given she has been in a boot immobilizer for 4 weeks and still has persistent focal pain ultrasound-guided needle fenestration and injection of the plantar fascia performed today.  Short course of anti-inflammatories for pain control.  Continue with boot immobilization x1 week then okay to wean out of as tolerated but will plan to check in with her in 2 weeks to ensure clinical improvement and plan to repeat ultrasound given the marked thickening noted today.  Follow-up: Return in about 2 weeks (around 06/06/2018).       Please see additional documentation for Objective, Assessment and Plan sections. Pertinent additional documentation may be included in corresponding procedure notes, imaging studies, problem based documentation and patient instructions. Please see these sections of the encounter for additional information regarding this visit.  CMA/ATC served as Neurosurgeonscribe during this visit. History,  Physical, and Plan performed by medical provider. Documentation and orders reviewed and attested to.      Andrena MewsMichael D Rigby, DO    [facility]

## 2018-06-06 ENCOUNTER — Ambulatory Visit: Payer: Self-pay

## 2018-06-06 ENCOUNTER — Encounter: Payer: Self-pay | Admitting: Sports Medicine

## 2018-06-06 ENCOUNTER — Ambulatory Visit: Payer: BLUE CROSS/BLUE SHIELD | Admitting: Sports Medicine

## 2018-06-06 VITALS — BP 110/68 | HR 69 | Ht 65.0 in | Wt 153.4 lb

## 2018-06-06 DIAGNOSIS — G8929 Other chronic pain: Secondary | ICD-10-CM

## 2018-06-06 DIAGNOSIS — M722 Plantar fascial fibromatosis: Secondary | ICD-10-CM | POA: Diagnosis not present

## 2018-06-06 DIAGNOSIS — M79672 Pain in left foot: Secondary | ICD-10-CM

## 2018-06-06 MED ORDER — IBANDRONATE SODIUM 150 MG PO TABS: ORAL_TABLET | ORAL | 0 refills | Status: DC

## 2018-06-06 NOTE — Progress Notes (Signed)
Brooke Carroll. Brooke Carroll Sports Medicine [facility] at [facility] [phone]  Brooke Carroll - 37 y.o. female MRN [id number]  Date of birth: Aug 07, 1981  Visit Date: 06/06/2018  PCP: Salli Real, MD   Referred by: Salli Real, MD  Scribe(s) for today's visit: Christoper Fabian, LAT, ATC  SUBJECTIVE:  Brooke Carroll is here for Follow-up (L heel pain and L plantar fasciitis) .    Her L foot apin symptoms INITIALLY: Began about 8 years ago and has been pretty constant over this time. Pain started while working 12 hour days at [employer]. She also injured foot on a trail run several years ago. 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  Described as moderate bruising, shooting and stabbing pain, radiating to the leg and knee. At times pain will shoot up to hip Worsened with weight bearing.  Improved with flip flops, inserts, lying or sitting.  Additional associated symptoms include: Pt reports that pain is similar to standing on a rock. Pain is not worse in the mornings, mostly when weight bearing.     At this time symptoms show no change compared to onset  She tried wearing better shoes and orthotic inserts with minimal relief. She has tried stretching and using a foam roller. She has tried wearing compression sleeve but then developed pain on the lateral aspect of her foot.  She has tried steroids, mobic, and prednisone with minima relief. She has been taking Advil daily.   Recent xray at [facility] 3-4 months ago.   03/21/18: Compared to the last office visit on 02/11/18, her previously described L heel pain and plantar fasciitis symptoms are worsening w/ new locations of pain noted on her L heel, now located laterally and at the post aspect of the L calcaneus in addition to the previous proximal, medial location. Current symptoms are an 8/10 & are radiating to L plantar fascia. She has been wearing her orthotics and doing her Alfredson's exercises.  She  has tried steroids, Mobic and Advil and is currently taking Advil q 6 hours.  04/18/2018: Compared to the last office visit, her previously described symptoms show no change. She has stopped running d/t pain.  Current symptoms are moderate (6/10) & are radiating to L plantar fascia.  She has been wearing her orthotics and doing her Alfredson's exercises.  She has tried steroids, Mobic and Advil and is currently taking Advil q 6 hours. She was prescribed Gabapentin but it didn't seem to make a difference.   05/23/2018: Compared to the last office visit on 04/18/18, her previously described L heel pain symptoms are improving but notes very slow improvement.  She reports no pain at rest and 1-2/10 pain when walking in the boot.  When she tries to walk out of the boot, she reports a sensation of her plantar fascia tearing. Current symptoms are mild-mod depending on whether she's in the boot or not & are radiating to L plantar fascia. She has been wearing a walking boot x one month.  She's been taking Boniva and using the nitroglycerin patches.  She has tried Gabapentin, steroids and Mobic and also takes Advil.  She has been doing Alfredson exercises as well.  06/06/18: Compared to the last office visit on 05/23/18, her previously described L heel pain symptoms are improving w/ much improved pain.  She notes that she still feels inflammation but can now stand w/o feeling like she's standing on a rock.  She notes the majority of her pain has dissipated.  She feels that she is 95% improved at this point. Current symptoms are mild & are nonradiating She is no longer wearing a walking boot.  She's been taking Boniva and using nitroglycerin patches.  She has tried Gabapentin, steroids and Mobic.  She continues to do her Alfredson's exercises. She had a L heel / plantar fascia injection at her last visit and feels that the injection helped tremendously.   REVIEW OF SYSTEMS: Denies night time  disturbances. Denies fevers, chills, or night sweats. Denies unexplained weight loss. Denies personal history of cancer. Denies changes in bowel or bladder habits. Denies recent unreported falls. Denies new or worsening dyspnea or wheezing. Denies headaches or dizziness.  Denies numbness, tingling or weakness  In the extremities.  Denies dizziness or presyncopal episodes Denies lower extremity edema    HISTORY:  Prior history reviewed and updated per electronic medical record.  Social History   Occupational History  . Not on file  Tobacco Use  . Smoking status: Never Smoker  . Smokeless tobacco: Never Used  Substance and Sexual Activity  . Alcohol use: Not on file  . Drug use: Not on file  . Sexual activity: Not on file   Social History   Social History Narrative  . Not on file    DATA OBTAINED & REVIEWED:  No results for input(s): HGBA1C, LABURIC, CREATINE in the last 8760 hours. .   OBJECTIVE:  VS:  HT:5\' 5"  (165.1 cm)   WT:153 lb 6.4 oz (69.6 kg)  BMI:25.53    BP:110/68  HR:69bpm  TEMP: ( )  RESP:98 %   PHYSICAL EXAM: CONSTITUTIONAL: Well-developed, Well-nourished and In no acute distress PSYCHIATRIC: Alert & appropriately interactive. and Not depressed or anxious appearing. RESPIRATORY: No increased work of breathing and Trachea Midline EYES: Pupils are equal., EOM intact without nystagmus. and No scleral icterus.  VASCULAR EXAM: Warm and well perfused NEURO: unremarkable  MSK Exam: Left foot  Well aligned, no significant deformity. No overlying skin changes. TTP over Base of the heel at the origin of the plantar fascia.  This is minimal.  Mild pain with calcaneal squeeze test.   RANGE OF MOTION & STRENGTH  Normal plantar flexion, dorsiflexion, inversion and eversion strength   SPECIALITY TESTING:  Normal ankle drawer testing.  Ligamentously stable     ASSESSMENT   1. Chronic heel pain, left   2. Plantar fasciitis of left foot     PLAN:   Pertinent additional documentation may be included in corresponding procedure notes, imaging studies, problem based documentation and patient instructions.  Procedures:  . None  Medications:  Meds ordered this encounter  Medications  . ibandronate (BONIVA) 150 MG tablet    Sig: Take 1 tab po in the AM every 30 days with a full glass of water, on an empty stomach, and do not lie down for the next 30 min.    Dispense:  3 tablet    Refill:  0   Discussion/Instructions: No problem-specific Assessment & Plan notes found for this encounter.  . Long discussion today regarding options for bony healing.  Given the ongoing pain we will consider injection but at this point will actually start her on Boniva to see if we can try to help decrease some of the underlying bone turnover.  . Continue with activity modifications   . Discussed red flag symptoms that warrant earlier emergent evaluation and patient voices understanding. . Activity modifications and the importance  of avoiding exacerbating activities (limiting pain to no more than a 4 / 10 during or following activity) recommended and discussed.  Follow-up:  . Return in about 1 month (around 07/04/2018).  . If any lack of improvement consider: . repeat corticosteroid injections      CMA/ATC served as scribe during this visit. History, Physical, and Plan performed by medical provider. Documentation and orders reviewed and attested to.      Andrena MewsMichael D Brittin Belnap, DO    [facility] Physician

## 2018-06-06 NOTE — Procedures (Signed)
IMITED MSK ULTRASOUND OF Left plantar fascia Images were obtained and interpreted by myself, Gaspar BiddingMichael Salihah Peckham, DO  Images have been saved and stored to PACS system. Images obtained on: GE S7 Ultrasound machine  FINDINGS:  Plantar fascial measures 0.38 cm in diameter.  There is no significant bony edema. Achilles is normal appearing without significant dumbbells shaped deformity  IMPRESSION:  1. improving plantar fasciitis with improved bony edema

## 2018-07-04 ENCOUNTER — Ambulatory Visit: Payer: BLUE CROSS/BLUE SHIELD | Admitting: Sports Medicine

## 2018-07-04 ENCOUNTER — Ambulatory Visit: Payer: Self-pay

## 2018-07-04 ENCOUNTER — Encounter: Payer: Self-pay | Admitting: Sports Medicine

## 2018-07-04 VITALS — BP 110/78 | HR 70 | Ht 65.0 in | Wt 150.4 lb

## 2018-07-04 DIAGNOSIS — L909 Atrophic disorder of skin, unspecified: Secondary | ICD-10-CM | POA: Diagnosis not present

## 2018-07-04 DIAGNOSIS — M79672 Pain in left foot: Secondary | ICD-10-CM | POA: Diagnosis not present

## 2018-07-04 DIAGNOSIS — G8929 Other chronic pain: Secondary | ICD-10-CM

## 2018-07-04 DIAGNOSIS — M722 Plantar fascial fibromatosis: Secondary | ICD-10-CM

## 2018-07-04 NOTE — Progress Notes (Signed)
Brooke Carroll. Brooke Carroll Sports Medicine [facility] at [facility] [phone]  Brooke Carroll - 37 y.o. female MRN [id number]  Date of birth: [birth year]  Visit Date: 07/04/2018  PCP: Salli Real, MD   Referred by: Salli Real, MD  Scribe(s) for today's visit: Stevenson Clinch, CMA  SUBJECTIVE:  Brooke Carroll is here for Follow-up (L heel pain)   Her L foot pain symptoms INITIALLY: Began about 8 years ago and has been pretty constant over this time. Pain started while working 12 hour days at [employer]. She also injured foot on a trail run several years ago. 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  Described as moderate bruising, shooting and stabbing pain, radiating to the leg and knee. At times pain will shoot up to hip Worsened with weight bearing.  Improved with flip flops, inserts, lying or sitting.  Additional associated symptoms include: Pt reports that pain is similar to standing on a rock. Pain is not worse in the mornings, mostly when weight bearing.    At this time symptoms show no change compared to onset  She tried wearing better shoes and orthotic inserts with minimal relief. She has tried stretching and using a foam roller. She has tried wearing compression sleeve but then developed pain on the lateral aspect of her foot.  She has tried steroids, mobic, and prednisone with minima relief. She has been taking Advil daily.  Recent xray at [facility] 3-4 months ago.   03/21/18: Compared to the last office visit on 02/11/18, her previously described L heel pain and plantar fasciitis symptoms are worsening w/ new locations of pain noted on her L heel, now located laterally and at the post aspect of the L calcaneus in addition to the previous proximal, medial location. Current symptoms are an 8/10 & are radiating to L plantar fascia. She has been wearing her orthotics and doing her Alfredson's exercises.  She has tried steroids, Mobic and  Advil and is currently taking Advil q 6 hours.  04/18/2018: Compared to the last office visit, her previously described symptoms show no change. She has stopped running d/t pain.  Current symptoms are moderate (6/10) & are radiating to L plantar fascia.  She has been wearing her orthotics and doing her Alfredson's exercises.  She has tried steroids, Mobic and Advil and is currently taking Advil q 6 hours. She was prescribed Gabapentin but it didn't seem to make a difference.   05/23/2018: Compared to the last office visit on 04/18/18, her previously described L heel pain symptoms are improving but notes very slow improvement.  She reports no pain at rest and 1-2/10 pain when walking in the boot.  When she tries to walk out of the boot, she reports a sensation of her plantar fascia tearing. Current symptoms are mild-mod depending on whether she's in the boot or not & are radiating to L plantar fascia. She has been wearing a walking boot x one month.  She's been taking Boniva and using the nitroglycerin patches.  She has tried Gabapentin, steroids and Mobic and also takes Advil.  She has been doing Alfredson exercises as well.  06/06/18: Compared to the last office visit on 05/23/18, her previously described L heel pain symptoms are improving w/ much improved pain.  She notes that she still feels inflammation but can now stand w/o feeling like she's standing on a rock.  She notes the majority of her pain has  dissipated.  She feels that she is 95% improved at this point. Current symptoms are mild & are nonradiating She is no longer wearing a walking boot.  She's been taking Boniva and using nitroglycerin patches.  She has tried Gabapentin, steroids and Mobic.  She continues to do her Alfredson's exercises. She had a L heel / plantar fascia injection at her last visit and feels that the injection helped tremendously.  07/04/2018: Compared to the last office visit, her previously described symptoms show no  change  Current symptoms are mild & are non-radiating She has been doing Alfredson exercises with no trouble. She feels 85-90% better over all.    REVIEW OF SYSTEMS: Denies night time disturbances. Denies fevers, chills, or night sweats. Denies unexplained weight loss. Denies personal history of cancer. Denies changes in bowel or bladder habits. Denies recent unreported falls. Denies new or worsening dyspnea or wheezing. Denies headaches or dizziness.  Denies numbness, tingling or weakness  In the extremities.  Denies dizziness or presyncopal episodes Denies lower extremity edema    HISTORY & PERTINENT PRIOR DATA:  Prior History reviewed and updated per electronic medical record.  Significant/pertinent history, findings, studies include:  reports that she has never smoked. She has never used smokeless tobacco. No results for input(s): HGBA1C, LABURIC, CREATINE in the last 8760 hours. No specialty comments available. No problems updated.  OBJECTIVE:  VS:  HT:5\' 5"  (165.1 cm)   WT:150 lb 6.4 oz (68.2 kg)  BMI:25.03    BP:110/78  HR:70bpm  TEMP: ( )  RESP:98 %   PHYSICAL EXAM: Constitutional: WDWN, Non-toxic appearing. Psychiatric: Alert & appropriately interactive.  Not depressed or anxious appearing. Respiratory: No increased work of breathing.  Trachea Midline Eyes: Pupils are equal.  EOM intact without nystagmus.  No scleral icterus  Vascular Exam: warm to touch no edema  upper extremity neuro exam: unremarkable normal strength normal sensation  MSK Exam: Left foot is overall well aligned with high cavus arch.  She has no significant midfoot deformity.  Mild TTP along the midportion of the origin of the plantar fascia but this is mild.  Her plantarflexion and dorsiflexion strength is normal with improved dorsiflexion to 110 degrees.   PROCEDURES & DATA REVIEWED:  n/a  ASSESSMENT & PLAN:   1. Chronic heel pain, left   2. Plantar fasciitis of left foot     3. Plantar fascia syndrome   4. Plantar fat pad atrophy     PLAN: Overall she is improving significantly following the last injection.  She is 95% symptom relief and is trying to increase her activities back to her prior level.  If any persistent ongoing problems could consider repeat in injection and localizing more to the midportion of the origin of the plantar fascia but anticipate this will continue to improve.  Follow-up: Return in about 6 weeks (around 08/15/2018) for repeat diagnostic ultrasound.      Please see additional documentation for Objective, Assessment and Plan sections. Pertinent additional documentation may be included in corresponding procedure notes, imaging studies, problem based documentation and patient instructions. Please see these sections of the encounter for additional information regarding this visit.  CMA/ATC served as Neurosurgeonscribe during this visit. History, Physical, and Plan performed by medical provider. Documentation and orders reviewed and attested to.      Andrena MewsMichael D Rigby, DO    [facility] Physician

## 2018-07-04 NOTE — Procedures (Signed)
LIMITED MSK ULTRASOUND OF Left foot Images were obtained and interpreted by myself, Gaspar BiddingMichael Valarie Farace, DO  Images have been saved and stored to PACS system. Images obtained on: GE S7 Ultrasound machine  FINDINGS:   Left Achilles is normal appearing  Left plantar fascial measures 0.32cm at the medial portion and and 0.41cm midportion.  IMPRESSION:  1. Improving plantar fasciitis

## 2018-08-15 ENCOUNTER — Ambulatory Visit: Payer: Self-pay

## 2018-08-15 ENCOUNTER — Encounter: Payer: Self-pay | Admitting: Sports Medicine

## 2018-08-15 ENCOUNTER — Ambulatory Visit: Payer: BLUE CROSS/BLUE SHIELD | Admitting: Sports Medicine

## 2018-08-15 VITALS — BP 102/72 | HR 50 | Ht 65.0 in | Wt 144.2 lb

## 2018-08-15 DIAGNOSIS — M722 Plantar fascial fibromatosis: Secondary | ICD-10-CM

## 2018-08-15 NOTE — Progress Notes (Signed)
Brooke Carroll. [name] at [facility] [phone]  Stephanee Barcomb - 37 y.o. female MRN [id number]  Date of birth: 15-Jun-1981  Visit Date: 08/15/2018  PCP: Salli Real, MD   Referred by: Salli Real, MD  Scribe(s) for today's visit: Stevenson Clinch, CMA  SUBJECTIVE:  Brooke Carroll is here for Follow-up (L heel pain)   Her L foot pain symptoms INITIALLY: Began about 8 years ago and has been pretty constant over this time. Pain started while working 12 hour days at [employer]. She also injured foot on a trail run several years ago. 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  Described as moderate bruising, shooting and stabbing pain, radiating to the leg and knee. At times pain will shoot up to hip Worsened with weight bearing.  Improved with flip flops, inserts, lying or sitting.  Additional associated symptoms include: Pt reports that pain is similar to standing on a rock. Pain is not worse in the mornings, mostly when weight bearing.    At this time symptoms show no change compared to onset  She tried wearing better shoes and orthotic inserts with minimal relief. She has tried stretching and using a foam roller. She has tried wearing compression sleeve but then developed pain on the lateral aspect of her foot.  She has tried steroids, mobic, and prednisone with minima relief. She has been taking Advil daily.  Recent xray at [facility] 3-4 months ago.   03/21/18: Compared to the last office visit on 02/11/18, her previously described L heel pain and plantar fasciitis symptoms are worsening w/ new locations of pain noted on her L heel, now located laterally and at the post aspect of the L calcaneus in addition to the previous proximal, medial location. Current symptoms are an 8/10 & are radiating to L plantar fascia. She has been wearing her orthotics and doing her Alfredson's exercises.  She has tried steroids, Mobic and  Advil and is currently taking Advil q 6 hours.  04/18/2018: Compared to the last office visit, her previously described symptoms show no change. She has stopped running d/t pain.  Current symptoms are moderate (6/10) & are radiating to L plantar fascia.  She has been wearing her orthotics and doing her Alfredson's exercises.  She has tried steroids, Mobic and Advil and is currently taking Advil q 6 hours. She was prescribed Gabapentin but it didn't seem to make a difference.   05/23/2018: Compared to the last office visit on 04/18/18, her previously described L heel pain symptoms are improving but notes very slow improvement.  She reports no pain at rest and 1-2/10 pain when walking in the boot.  When she tries to walk out of the boot, she reports a sensation of her plantar fascia tearing. Current symptoms are mild-mod depending on whether she's in the boot or not & are radiating to L plantar fascia. She has been wearing a walking boot x one month.  She's been taking Boniva and using the nitroglycerin patches.  She has tried Gabapentin, steroids and Mobic and also takes Advil.  She has been doing Alfredson exercises as well.  06/06/18: Compared to the last office visit on 05/23/18, her previously described L heel pain symptoms are improving w/ much improved pain.  She notes that she still feels inflammation but can now stand w/o feeling like she's standing on a rock.  She notes the majority of her pain has  dissipated.  She feels that she is 95% improved at this point. Current symptoms are mild & are nonradiating She is no longer wearing a walking boot.  She's been taking Boniva and using nitroglycerin patches.  She has tried Gabapentin, steroids and Mobic.  She continues to do her Alfredson's exercises. She had a L heel / plantar fascia injection at her last visit and feels that the injection helped tremendously.  07/04/2018: Compared to the last office visit, her previously described symptoms show no  change  Current symptoms are mild & are non-radiating She has been doing Alfredson exercises with no trouble. She feels 85-90% better over all.   08/15/2018: Compared to the last office visit, her previously described symptoms are worsening. She feels that pain has moved about 1/4 in toward the lateral aspect of the foot. Sx began to flare up about 2 weeks ago.  Current symptoms are mild-moderate & are nonradiating She has been taking IBU and doing Alfredson exercises with minimal relief.     REVIEW OF SYSTEMS: Denies night time disturbances. Denies fevers, chills, or night sweats. Denies unexplained weight loss. Denies personal history of cancer. Denies changes in bowel or bladder habits. Denies recent unreported falls. Denies new or worsening dyspnea or wheezing. Denies headaches or dizziness.  Denies numbness, tingling or weakness  In the extremities.  Denies dizziness or presyncopal episodes Denies lower extremity edema     HISTORY:  Prior history reviewed and updated per electronic medical record.  Social History   Occupational History  . Not on file  Tobacco Use  . Smoking status: Never Smoker  . Smokeless tobacco: Never Used  Substance and Sexual Activity  . Alcohol use: Not on file  . Drug use: Not on file  . Sexual activity: Not on file   Social History   Social History Narrative  . Not on file    DATA OBTAINED & REVIEWED:  No results for input(s): HGBA1C, LABURIC, CREATINE in the last 8760 hours. MRI of the left ankle on 04/05/2018 shows  1. Soft tissue edema and thickening of medial band of the plantar fascia with subcortical reactive marrow edema at the calcaneal insertion most consistent with moderate plantar fasciitis. 2. Short-segment longitudinal split tear of the peroneus brevis at the level of lateral malleolus. Serial MSK ultrasound of the left plantar fascia do show slow but steady improvement Status post ultrasound-guided plantar fascia injection  with needle fenestration on 05/23/2018   OBJECTIVE:  VS:  HT:5\' 5"  (165.1 cm)   WT:144 lb 3.2 oz (65.4 kg)  BMI:24    BP:102/72  HR:(!) 50bpm  TEMP: ( )  RESP:98 %   PHYSICAL EXAM: CONSTITUTIONAL: Well-developed, Well-nourished and In no acute distress Psychiatric: Alert & appropriately interactive. and Not depressed or anxious appearing. RESPIRATORY: No increased work of breathing and Trachea Midline EYES: Pupils are equal., EOM intact without nystagmus. and No scleral icterus.  Lower extremities: Warm and well perfused NEURO: unremarkable  MSK Exam: Left Foot: . Well aligned, high cavus arch . TTP over Medial portion of the plantar fascia origin, this is improved . Non tender over Posterior aspect of the calcaneus, peroneal or posterior tibialis tendons. . Normal plantar flexion, dorsiflexion, inversion and eversion strength . Ligamentously stable . Plantarflexion and dorsiflexion strength is normal with normal dorsiflexion to greater than 110 degrees in both a bent knee and straight knee position.   ASSESSMENT   1. Plantar fasciitis of left foot     PLAN & PROCEDURES:   .  Pertinent additional documentation may be included in corresponding procedure notes, imaging studies, problem based documentation and patient instructions. . None . Continue with custom insoles previously fabricated . Continue your previously prescribed home exercise program . Apply ice packs . Overall she is doing significantly better reporting 90% improvement, okay to try to return to activities as tolerated.  We will plan to have her follow-up if any worsening features.  No orders of the defined types were placed in this encounter.   Follow-up: Return if symptoms worsen or fail to improve, for as needed for ongoing issues.  If any lack of improvement consider Ortho referral for potential plantar fascial release     CMA/ATC served as scribe during this visit. History, Physical, and Plan  performed by medical provider.  Documentation and orders reviewed and attested to.      [name], DO    [facility]

## 2018-08-15 NOTE — Procedures (Signed)
LIMITED MSK ULTRASOUND OF Left plantar fascia Images were obtained and interpreted by myself, Gaspar Bidding, DO  Images have been saved and stored to PACS system. Images obtained on: GE S7 Ultrasound machine  FINDINGS:   Plantar fascia has significantly thinned out however continues to have some bony changes at the level of the lateral aspect of the calcaneus.  Measurements are significantly improved.  IMPRESSION:  1. Resolving plantar fasciitis with persistent bony changes and heel spur

## 2018-08-18 ENCOUNTER — Encounter: Payer: Self-pay | Admitting: Sports Medicine

## 2018-08-18 ENCOUNTER — Ambulatory Visit (INDEPENDENT_AMBULATORY_CARE_PROVIDER_SITE_OTHER): Payer: BLUE CROSS/BLUE SHIELD

## 2018-08-18 ENCOUNTER — Ambulatory Visit: Payer: Self-pay

## 2018-08-18 ENCOUNTER — Ambulatory Visit: Payer: BLUE CROSS/BLUE SHIELD | Admitting: Family Medicine

## 2018-08-18 ENCOUNTER — Encounter: Payer: Self-pay | Admitting: Family Medicine

## 2018-08-18 VITALS — BP 120/74 | HR 57 | Ht 65.0 in | Wt 145.2 lb

## 2018-08-18 DIAGNOSIS — M25572 Pain in left ankle and joints of left foot: Secondary | ICD-10-CM

## 2018-08-18 DIAGNOSIS — M7989 Other specified soft tissue disorders: Secondary | ICD-10-CM | POA: Diagnosis not present

## 2018-08-18 NOTE — Assessment & Plan Note (Addendum)
An acute left ankle injury with findings on ultrasound to suggest she has a retinacular tear.  I do not appreciate a fracture or any tendon rupture.  -X-ray. -Counseled on home exercise therapy. -Provided samples of Duexis. -Counseled on supportive therapy. -Provided information about body helix. -Can follow-up in 3 to 4 weeks

## 2018-08-18 NOTE — Addendum Note (Signed)
Addended by: Varney BilesWIESNER, Wisdom Seybold M on: 08/18/2018 10:23 AM   Modules accepted: Orders

## 2018-08-18 NOTE — Progress Notes (Signed)
Brooke Carroll - 37 y.o. female MRN [id number]  Date of birth: Apr 15, 1981  SUBJECTIVE:  Including CC & ROS.  Chief Complaint  Patient presents with  . Initial Assessment    L ankle pain    Brooke Carroll is a 37 y.o. female that is presenting w/ c/o L lateral ankle pain that started yesterday (08/17/18) when she stepped on a branch while running and rolled her L ankle into inversion.  She states that she had immediate pain but does not recall whether or not she heard/felt a pop or snap.  She currently has swelling and bruising along the L lateral malleous.  She rates her pain as mild at rest and a 7-8/10 when walking.  She also reports some "snapping" of the tendons along the lateral aspect of her L ankle.  She has been icing her ankle and taking IBU. She has also been wearing an ankle compression sleeve as well as elevating her ankle as much as possible.  No N/T noted in her L foot.  She has recently been treated by Dr. Berline Chough for L foot plantar fasciitis and was recently released to resume her normal exercise activities.  Review of the left ankle MRI from 4/30 shows findings suggestive of plantar fasciitis.  It also shows a split tear of the peroneal brevis.   Review of Systems  Constitutional: Negative for fever.  HENT: Negative for congestion.   Respiratory: Negative for cough.   Cardiovascular: Negative for chest pain.  Gastrointestinal: Negative for abdominal pain.  Musculoskeletal: Positive for gait problem.  Skin: Positive for color change.  Neurological: Negative for weakness.  Hematological: Negative for adenopathy.  Psychiatric/Behavioral: Negative for agitation.    HISTORY: Past Medical, Surgical, Social, and Family History Reviewed & Updated per EMR.   Pertinent Historical Findings include:  No past medical history on file.  No past surgical history on file.  No Known Allergies  No family history on file.   Social History   Socioeconomic History  . Marital status:  Married    Spouse name: Not on file  . Number of children: Not on file  . Years of education: Not on file  . Highest education level: Not on file  Occupational History  . Not on file  Social Needs  . Financial resource strain: Not on file  . Food insecurity:    Worry: Not on file    Inability: Not on file  . Transportation needs:    Medical: Not on file    Non-medical: Not on file  Tobacco Use  . Smoking status: Never Smoker  . Smokeless tobacco: Never Used  Substance and Sexual Activity  . Alcohol use: Not on file  . Drug use: Not on file  . Sexual activity: Not on file  Lifestyle  . Physical activity:    Days per week: Not on file    Minutes per session: Not on file  . Stress: Not on file  Relationships  . Social connections:    Talks on phone: Not on file    Gets together: Not on file    Attends religious service: Not on file    Active member of club or organization: Not on file    Attends meetings of clubs or organizations: Not on file    Relationship status: Not on file  . Intimate partner violence:    Fear of current or ex partner: Not on file    Emotionally abused: Not on file    Physically abused: Not  on file    Forced sexual activity: Not on file  Other Topics Concern  . Not on file  Social History Narrative  . Not on file     PHYSICAL EXAM:  VS: BP 120/74 (BP Location: Left Arm, Patient Position: Sitting, Cuff Size: Normal)   Pulse (!) 57   Ht 5\' 5"  (1.651 m)   Wt 145 lb 3.2 oz (65.9 kg)   SpO2 98%   BMI 24.16 kg/m  Physical Exam Gen: NAD, alert, cooperative with exam, well-appearing ENT: normal lips, normal nasal mucosa,  Eye: normal EOM, normal conjunctiva and lids CV:  no edema, +2 pedal pulses   Resp: no accessory muscle use, non-labored,  Skin: no rashes, no areas of induration  Neuro: normal tone, normal sensation to touch Psych:  normal insight, alert and oriented MSK:  Left ankle:  Redness and swelling at the level of the lateral  malleolus. Some tenderness to palpation over the lateral malleolus and cuboid No tenderness to palpation over the medial malleolus, navicular or base the fifth metatarsal. Pain with resistance to eversion. Some limitations in dorsiflexion secondary to pain. Appears to have instability of peroneal tendons at the level of the lateral malleolus Neurovascularly intact  Limited ultrasound: left ankle:  Split of the peroneal brevis at the level of the lateral malleolus is appreciated.  No significant swelling of the peroneal tendons at the level of the malleolus.  There appears to be instability in dynamic testing of the peroneal tendons with some scissoring action occurring.  This is more extravagant than compared to the contralateral side. No fracture of the distal fibula appreciated. There is a hypoechoic change in the level of the midfoot between the talus and cuboid to suspect an effusion. Some soft tissue swelling appreciated at the level lateral malleolus. Normal insertion of the peroneal brevis into the base of the fifth metatarsal. No fracture of the fifth metatarsal appreciated  Summary: Appears that the retinaculum may be partially torn with the movement of the peroneal tendons.  Ultrasound and interpretation by Clare GandyJeremy Hodari Chuba, MD     ASSESSMENT & PLAN:   Acute left ankle pain An acute left ankle injury with findings on ultrasound to suggest she has a retinacular tear.  I do not appreciate a fracture or any tendon rupture on ultrasound today. -X-ray. -Counseled on home exercise therapy. -Provided samples of Duexis. -Counseled on supportive therapy. -Provided information about body helix. -Can follow-up in 3 to 4 weeks

## 2018-08-18 NOTE — Patient Instructions (Signed)
Nice to meet you  Please try the exercises  Please continue to ice  Please try anti-inflammatory for pain  Please see us back in 3-4 weeks.

## 2018-08-19 ENCOUNTER — Telehealth: Payer: Self-pay | Admitting: Family Medicine

## 2018-08-19 NOTE — Telephone Encounter (Signed)
Pt returning call and results given per notes of Dr. Jordan LikesSchmitz on 08/19/18. Pt verbalized understanding.

## 2018-08-19 NOTE — Telephone Encounter (Signed)
Left VM for patient. If she calls back please have her speak with a nurse/CMA and inform that her xrays are normal. The [facility] can report results to patient.   If any questions then please take the best time and phone number to call and I will try to call her back.   Myra RudeSchmitz, Laderrick Wilk E, MD  Primary Care and Sports Medicine 08/19/2018, 5:42 PM

## 2018-08-21 ENCOUNTER — Encounter: Payer: Self-pay | Admitting: Sports Medicine

## 2018-09-02 ENCOUNTER — Encounter: Payer: Self-pay | Admitting: Sports Medicine

## 2018-09-02 ENCOUNTER — Ambulatory Visit: Payer: BLUE CROSS/BLUE SHIELD | Admitting: Sports Medicine

## 2018-09-02 ENCOUNTER — Ambulatory Visit: Payer: Self-pay

## 2018-09-02 VITALS — BP 118/66 | HR 65 | Ht 65.0 in | Wt 147.6 lb

## 2018-09-02 DIAGNOSIS — S93492A Sprain of other ligament of left ankle, initial encounter: Secondary | ICD-10-CM

## 2018-09-02 DIAGNOSIS — M25572 Pain in left ankle and joints of left foot: Secondary | ICD-10-CM | POA: Diagnosis not present

## 2018-09-02 NOTE — Progress Notes (Signed)
Brooke Carroll - 37 y.o. female MRN [id number]  Date of birth: 01-20-1981  SUBJECTIVE:  Including CC & ROS.  Chief Complaint  Patient presents with  . Follow-up    L ankle sprain    Brooke Carroll is a 37 y.o. female that is presenting w/ c/o L lateral ankle pain that started yesterday (08/17/18) when she stepped on a branch while running and rolled her L ankle into inversion.  She states that she had immediate pain but does not recall whether or not she heard/felt a pop or snap.  She currently has swelling and bruising along the L lateral malleous.  She rates her pain as mild at rest and a 7-8/10 when walking.  She also reports some "snapping" of the tendons along the lateral aspect of her L ankle.  She has been icing her ankle and taking IBU. She has also been wearing an ankle compression sleeve as well as elevating her ankle as much as possible.  No N/T noted in her L foot.  She has recently been treated by Dr. Berline Chough for L foot plantar fasciitis and was recently released to resume her normal exercise activities.  Review of the left ankle MRI from 4/30 shows findings suggestive of plantar fasciitis.  It also shows a split tear of the peroneal brevis.   09/02/2018: Compared to the last office visit on 08/18/18 w/ Dr. Jordan Likes, her previously described L ankle pain symptoms are improving w/ an approximate 90% improvement.  She notes some con't soreness and swelling along the post aspect of her L lateral malleolus.  She reports no pain w/ normal daily ambulation but has not attempted to resume running. Current symptoms are mild & are nonradiating She has been taking Duexis but finished her samples.  She's been wearing a CEP compression sleeve on her L ankle.  She has been doing her HEP.  L ankle XR - 08/18/18  Review of Systems  Constitutional: Negative for fever.  HENT: Negative for congestion.   Respiratory: Negative for cough.   Cardiovascular: Negative for chest pain.  Gastrointestinal: Negative  for abdominal pain.  Musculoskeletal: Positive for gait problem.  Skin: Positive for color change.  Neurological: Negative for weakness.  Hematological: Negative for adenopathy.  Psychiatric/Behavioral: Negative for agitation.    HISTORY:  Prior history reviewed and updated per electronic medical record.  Social History   Occupational History  . Not on file  Tobacco Use  . Smoking status: Never Smoker  . Smokeless tobacco: Never Used  Substance and Sexual Activity  . Alcohol use: Not on file  . Drug use: Not on file  . Sexual activity: Not on file   Social History   Social History Narrative  . Not on file    DATA OBTAINED & REVIEWED:  No results for input(s): HGBA1C, LABURIC, CREATINE in the last 8760 hours. No problems updated.  OBJECTIVE:  VS:  HT:5\' 5"  (165.1 cm)   WT:147 lb 9.6 oz (67 kg)  BMI:24.56    BP:118/66  HR:65bpm  TEMP: ( )  RESP:99 %   PHYSICAL EXAM: CONSTITUTIONAL: Well-developed, Well-nourished and In no acute distress PSYCHIATRIC: Alert & appropriately interactive. and Not depressed or anxious appearing. RESPIRATORY: No increased work of breathing and Trachea Midline EYES: Pupils are equal., EOM intact without nystagmus. and No scleral icterus.  VASCULAR EXAM: Warm and well perfused NEURO: unremarkable  MSK Exam: Left ankle  Well aligned, no significant deformity. Small amount of anterior lateral swelling.  No focal bruising or ecchymosis.  TTP over Anterior lateral ankle.   RANGE OF MOTION & STRENGTH  Plantarflexion, dorsiflexion, inversion and eversion strength and range of motion.   SPECIALITY TESTING:  Ankle drawer testing, talar tilting ankle drawer testing  Normal Klieger testing.  Small amount of pain over the anterior lateral ankle consistent with directly over the ATFL.     ASSESSMENT   1. Acute left ankle pain   2. Sprain of anterior talofibular ligament of left ankle, initial encounter     PLAN:  Pertinent additional  documentation may be included in corresponding procedure notes, imaging studies, problem based documentation and patient instructions.  Procedures:  . None  Medications:  No orders of the defined types were placed in this encounter.  Discussion/Instructions: No problem-specific Assessment & Plan notes found for this encounter.  . Fairly classic ankle sprain.  Should do well with continued conservative measures. . Discussed red flag symptoms that warrant earlier emergent evaluation and patient voices understanding. . Activity modifications and the importance of avoiding exacerbating activities (limiting pain to no more than a 4 / 10 during or following activity) recommended and discussed. . >50% of this 25 minutes minute visit spent in direct patient counseling and/or coordination of care. Discussion was focused on education regarding the in discussing the pathoetiology and anticipated clinical course of the above condition.  Follow-up:  . Return in about 4 weeks (around 09/30/2018) for repeat clinical exam, repeat diagnostic ultrasound.  . If any lack of improvement consider: further diagnostic evaluation with Repeat MSK ultrasound and/or advanced diagnostic imaging if any worsening features.      CMA/ATC served as Neurosurgeon during this visit. History, Physical, and Plan performed by medical provider. Documentation and orders reviewed and attested to.      [name], DO    [facility] Physician

## 2018-09-02 NOTE — Procedures (Signed)
LIMITED MSK ULTRASOUND OF Left ankle Images were obtained and interpreted by myself, Gaspar Bidding, DO  Images have been saved and stored to PACS system. Images obtained on: GE S7 Ultrasound machine  FINDINGS:   ATFL does appear to be slightly stretched out with slight fragmentation of the distal fibula.  Small joint effusion with small amount of generalized synovitis  Fibularis brevis tendon does appear to have a small longitudinal split but this is minimal and there is only minimal fluid within the tendon sheath.  IMPRESSION:  1. Anterior ankle sprain with a small amount of residual synovitis 2. Fibularis brevis tendon split

## 2018-09-02 NOTE — Patient Instructions (Addendum)
I recommend you obtained a compression sleeve to help with your joint problems. There are many options on the market however I recommend obtaining a ankle Body Helix compression sleeve.  You can find information (including how to appropriate measure yourself for sizing) can be found at www.Body Helix.com.  Many of these products are health savings account (HSA) eligible.   You can use the compression sleeve at any time throughout the day but is most important to use while being active as well as for 2 hours post-activity.   It is appropriate to ice following activity with the compression sleeve in place.   

## 2018-09-30 ENCOUNTER — Ambulatory Visit: Payer: BLUE CROSS/BLUE SHIELD | Admitting: Sports Medicine

## 2018-10-31 ENCOUNTER — Other Ambulatory Visit: Payer: Self-pay | Admitting: Sports Medicine

## 2018-11-12 IMAGING — MR MR ANKLE*L* W/O CM
3 of 5 series · 8 of 40 positions shown · non-contrast
Comparison: None.

CLINICAL DATA: Ankle pain.

EXAM:
MRI OF THE LEFT ANKLE WITHOUT CONTRAST
TECHNIQUE: Multiplanar, multisequence MR imaging of the ankle was performed. No
intravenous contrast was administered.

[Series 3: PD fat-sat · axial · left · 4.0mm · 0.20mm/px · z∈[-79,+9]mm · 3 of 27 slices shown]
[im 4/27]
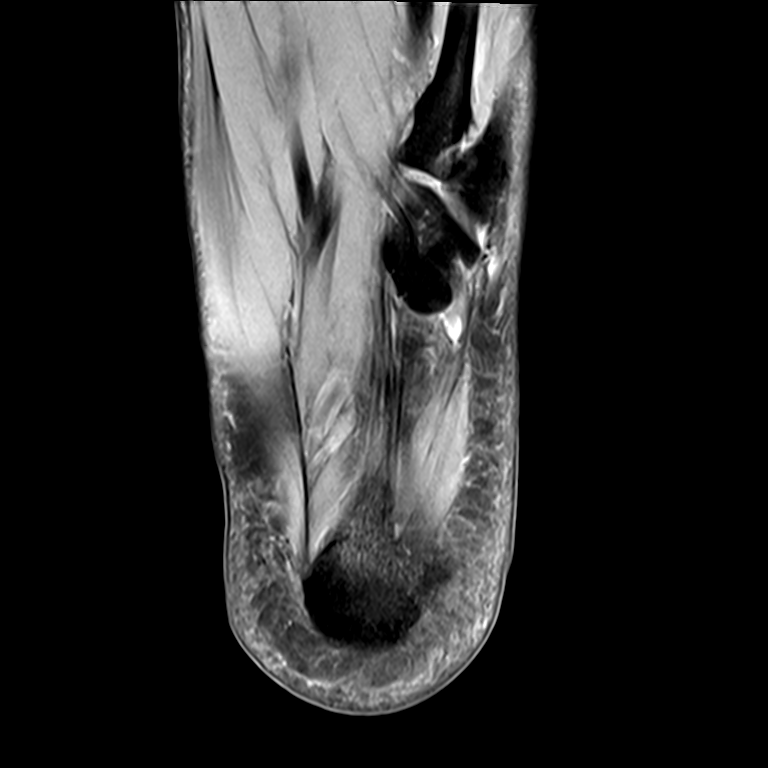
[im 15/27]
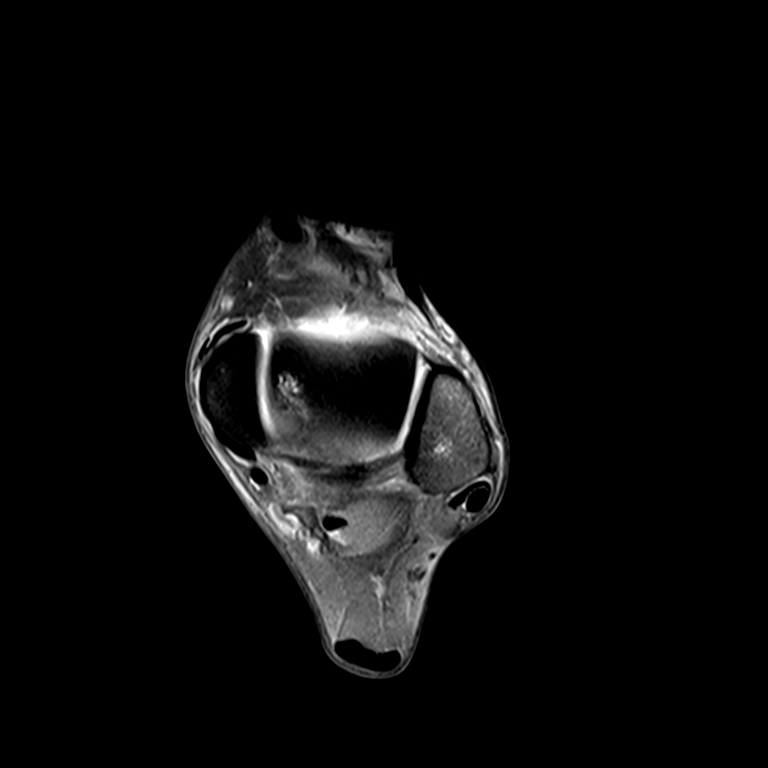
[im 23/27]
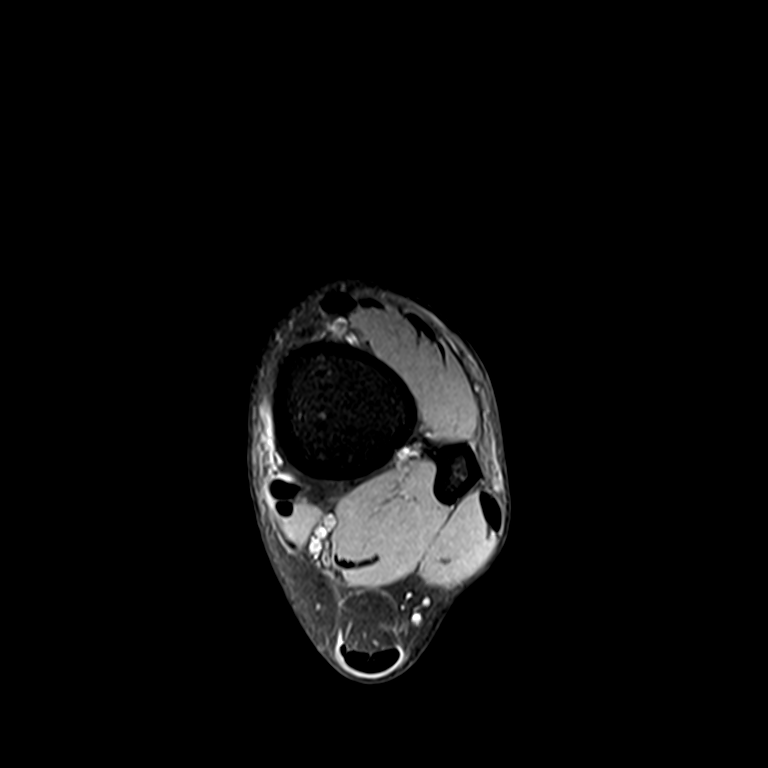

[Series 4: T2 fat-sat · axial · left · 4.0mm · 0.20mm/px · z∈[-74,+4]mm · 3 of 27 slices shown (1 of 2)]
[im 5/27]
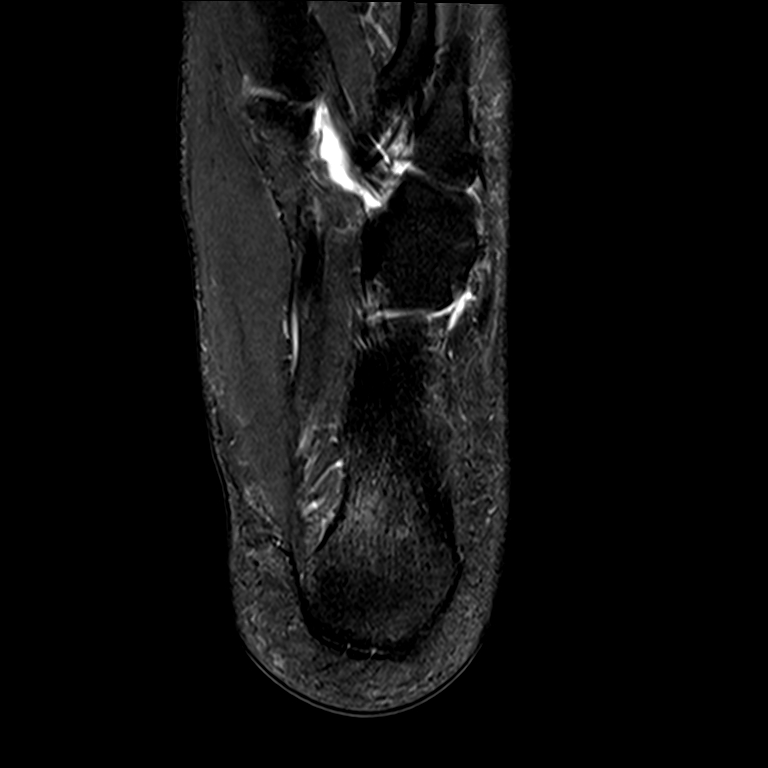
[im 14/27]
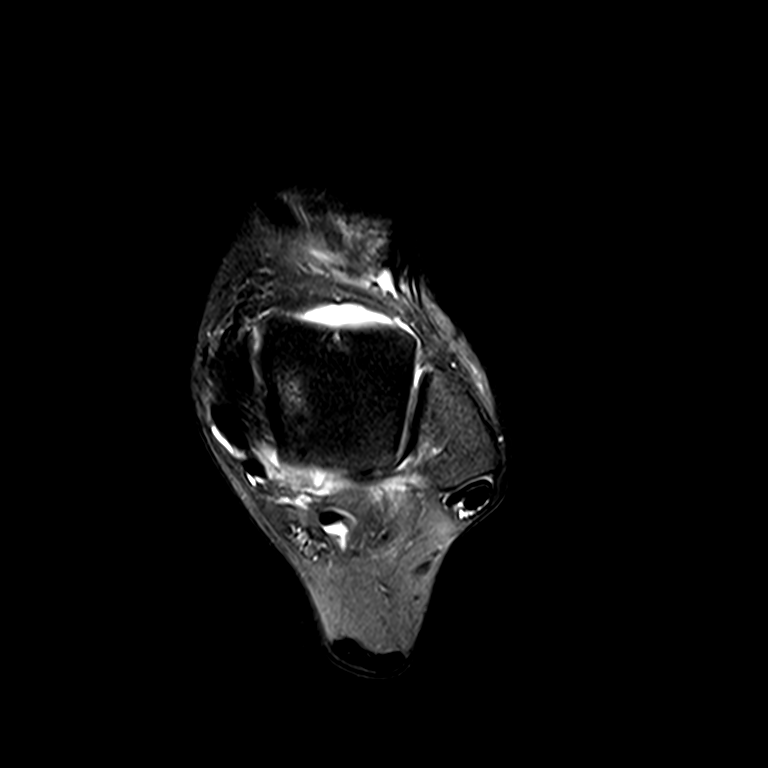
[im 22/27]
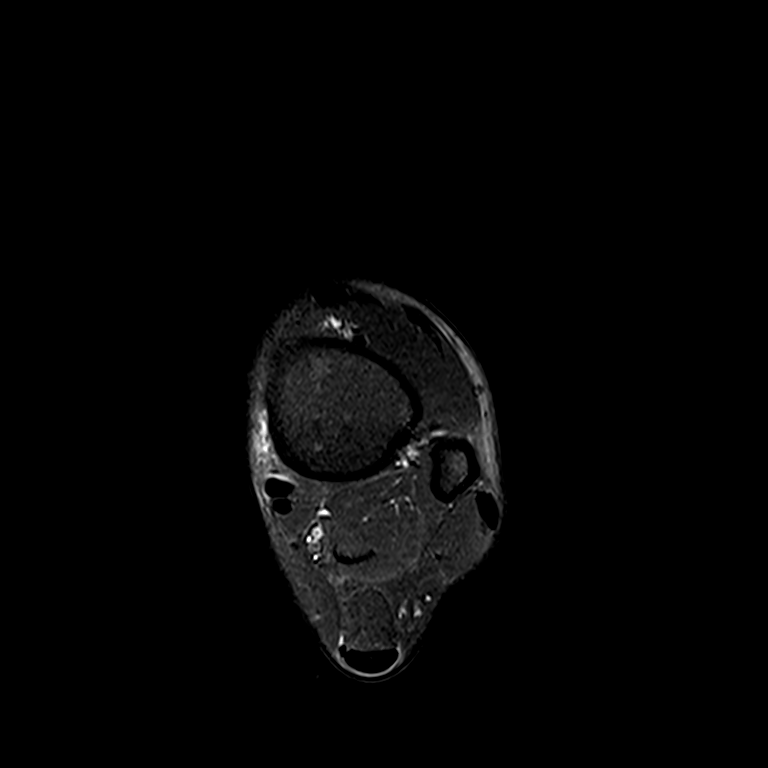

[Series 5: T2 fat-sat · sagittal · left · 2.5mm · 0.21mm/px · 2 of 30 slices shown (2 of 2)]
[im 5/30]
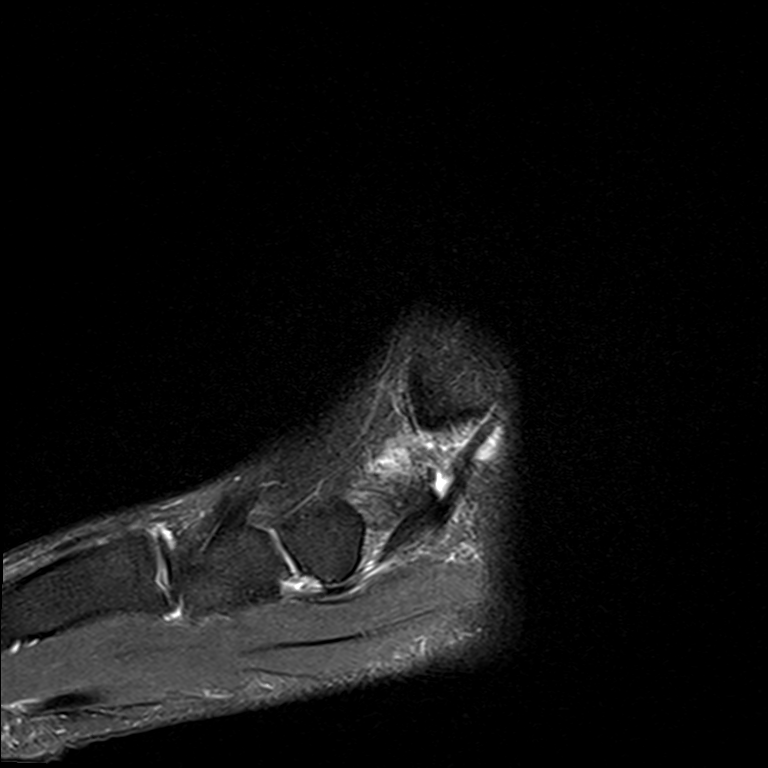
[im 17/30]
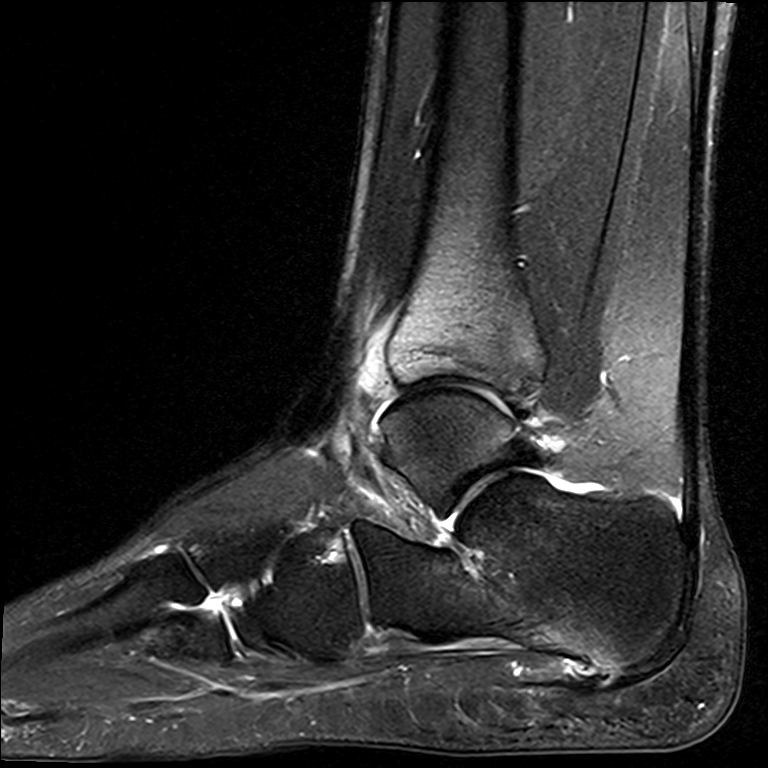

[8 of 40 positions shown; findings below may reference images not displayed]

FINDINGS: TENDONS

Peroneal: Peroneal longus tendon intact. Short-segment longitudinal
split tear of the peroneus brevis at the level of lateral malleolus.

Posteromedial: Posterior tibial tendon intact. Flexor hallucis
longus tendon intact. Flexor digitorum longus tendon intact.

Anterior: Tibialis anterior tendon intact. Extensor hallucis longus
tendon intact Extensor digitorum longus tendon intact.

Achilles:  Intact.

Plantar Fascia: Soft tissue edema and thickening of medial band of
the plantar fascia with subcortical reactive marrow edema at the
calcaneal insertion most consistent with moderate plantar fasciitis.

LIGAMENTS

Lateral: Anterior talofibular ligament intact. Calcaneofibular
ligament intact. Posterior talofibular ligament intact. Anterior and
posterior tibiofibular ligaments intact.

Medial: Deltoid ligament intact. Spring ligament intact.

CARTILAGE

Ankle Joint: No joint effusion. Normal ankle mortise. No chondral
defect.

Subtalar Joints/Sinus Tarsi: Normal subtalar joints. No subtalar
joint effusion. Normal sinus tarsi.

Bones: No aggressive osseous lesion. No fracture or dislocation.
Mild osteoarthritis of the second tarsometatarsal joint with sub
chondral reactive marrow changes.

Soft Tissue: No fluid collection or hematoma.
IMPRESSION: 1. Soft tissue edema and thickening of medial band of the plantar
fascia with subcortical reactive marrow edema at the calcaneal
insertion most consistent with moderate plantar fasciitis.
2. Short-segment longitudinal split tear of the peroneus brevis at
the level of lateral malleolus.

## 2019-01-03 DIAGNOSIS — Z6824 Body mass index (BMI) 24.0-24.9, adult: Secondary | ICD-10-CM | POA: Diagnosis not present

## 2019-01-03 DIAGNOSIS — Z01419 Encounter for gynecological examination (general) (routine) without abnormal findings: Secondary | ICD-10-CM | POA: Diagnosis not present

## 2019-01-20 DIAGNOSIS — M722 Plantar fascial fibromatosis: Secondary | ICD-10-CM | POA: Diagnosis not present

## 2019-01-20 DIAGNOSIS — S93412D Sprain of calcaneofibular ligament of left ankle, subsequent encounter: Secondary | ICD-10-CM | POA: Diagnosis not present

## 2019-01-31 DIAGNOSIS — S93412D Sprain of calcaneofibular ligament of left ankle, subsequent encounter: Secondary | ICD-10-CM | POA: Diagnosis not present

## 2019-01-31 DIAGNOSIS — M79672 Pain in left foot: Secondary | ICD-10-CM | POA: Diagnosis not present

## 2019-01-31 DIAGNOSIS — R262 Difficulty in walking, not elsewhere classified: Secondary | ICD-10-CM | POA: Diagnosis not present

## 2019-01-31 DIAGNOSIS — M722 Plantar fascial fibromatosis: Secondary | ICD-10-CM | POA: Diagnosis not present

## 2019-02-07 DIAGNOSIS — S93412D Sprain of calcaneofibular ligament of left ankle, subsequent encounter: Secondary | ICD-10-CM | POA: Diagnosis not present

## 2019-02-07 DIAGNOSIS — M722 Plantar fascial fibromatosis: Secondary | ICD-10-CM | POA: Diagnosis not present

## 2019-02-07 DIAGNOSIS — M79672 Pain in left foot: Secondary | ICD-10-CM | POA: Diagnosis not present

## 2019-02-07 DIAGNOSIS — R262 Difficulty in walking, not elsewhere classified: Secondary | ICD-10-CM | POA: Diagnosis not present

## 2019-02-09 DIAGNOSIS — R262 Difficulty in walking, not elsewhere classified: Secondary | ICD-10-CM | POA: Diagnosis not present

## 2019-02-09 DIAGNOSIS — M722 Plantar fascial fibromatosis: Secondary | ICD-10-CM | POA: Diagnosis not present

## 2019-02-09 DIAGNOSIS — S93412D Sprain of calcaneofibular ligament of left ankle, subsequent encounter: Secondary | ICD-10-CM | POA: Diagnosis not present

## 2019-02-09 DIAGNOSIS — M79672 Pain in left foot: Secondary | ICD-10-CM | POA: Diagnosis not present

## 2019-02-13 DIAGNOSIS — M722 Plantar fascial fibromatosis: Secondary | ICD-10-CM | POA: Diagnosis not present

## 2019-02-13 DIAGNOSIS — S93412D Sprain of calcaneofibular ligament of left ankle, subsequent encounter: Secondary | ICD-10-CM | POA: Diagnosis not present

## 2019-02-13 DIAGNOSIS — R262 Difficulty in walking, not elsewhere classified: Secondary | ICD-10-CM | POA: Diagnosis not present

## 2019-02-13 DIAGNOSIS — M79672 Pain in left foot: Secondary | ICD-10-CM | POA: Diagnosis not present

## 2019-02-16 DIAGNOSIS — M722 Plantar fascial fibromatosis: Secondary | ICD-10-CM | POA: Diagnosis not present

## 2019-02-16 DIAGNOSIS — M79672 Pain in left foot: Secondary | ICD-10-CM | POA: Diagnosis not present

## 2019-02-16 DIAGNOSIS — S93412D Sprain of calcaneofibular ligament of left ankle, subsequent encounter: Secondary | ICD-10-CM | POA: Diagnosis not present

## 2019-02-16 DIAGNOSIS — R262 Difficulty in walking, not elsewhere classified: Secondary | ICD-10-CM | POA: Diagnosis not present

## 2019-02-20 DIAGNOSIS — M722 Plantar fascial fibromatosis: Secondary | ICD-10-CM | POA: Diagnosis not present

## 2019-02-20 DIAGNOSIS — S93412D Sprain of calcaneofibular ligament of left ankle, subsequent encounter: Secondary | ICD-10-CM | POA: Diagnosis not present

## 2019-02-20 DIAGNOSIS — R262 Difficulty in walking, not elsewhere classified: Secondary | ICD-10-CM | POA: Diagnosis not present

## 2019-02-20 DIAGNOSIS — M79672 Pain in left foot: Secondary | ICD-10-CM | POA: Diagnosis not present

## 2019-02-22 DIAGNOSIS — M79672 Pain in left foot: Secondary | ICD-10-CM | POA: Diagnosis not present

## 2019-02-22 DIAGNOSIS — R262 Difficulty in walking, not elsewhere classified: Secondary | ICD-10-CM | POA: Diagnosis not present

## 2019-02-22 DIAGNOSIS — M722 Plantar fascial fibromatosis: Secondary | ICD-10-CM | POA: Diagnosis not present

## 2019-02-22 DIAGNOSIS — S93412D Sprain of calcaneofibular ligament of left ankle, subsequent encounter: Secondary | ICD-10-CM | POA: Diagnosis not present

## 2019-02-27 DIAGNOSIS — R262 Difficulty in walking, not elsewhere classified: Secondary | ICD-10-CM | POA: Diagnosis not present

## 2019-02-27 DIAGNOSIS — M79672 Pain in left foot: Secondary | ICD-10-CM | POA: Diagnosis not present

## 2019-02-27 DIAGNOSIS — M722 Plantar fascial fibromatosis: Secondary | ICD-10-CM | POA: Diagnosis not present

## 2019-02-27 DIAGNOSIS — S93412D Sprain of calcaneofibular ligament of left ankle, subsequent encounter: Secondary | ICD-10-CM | POA: Diagnosis not present

## 2019-03-01 DIAGNOSIS — R262 Difficulty in walking, not elsewhere classified: Secondary | ICD-10-CM | POA: Diagnosis not present

## 2019-03-01 DIAGNOSIS — M722 Plantar fascial fibromatosis: Secondary | ICD-10-CM | POA: Diagnosis not present

## 2019-03-01 DIAGNOSIS — S93412D Sprain of calcaneofibular ligament of left ankle, subsequent encounter: Secondary | ICD-10-CM | POA: Diagnosis not present

## 2019-03-01 DIAGNOSIS — M79672 Pain in left foot: Secondary | ICD-10-CM | POA: Diagnosis not present

## 2019-03-06 DIAGNOSIS — M722 Plantar fascial fibromatosis: Secondary | ICD-10-CM | POA: Diagnosis not present

## 2019-03-06 DIAGNOSIS — M79672 Pain in left foot: Secondary | ICD-10-CM | POA: Diagnosis not present

## 2019-03-13 DIAGNOSIS — M79672 Pain in left foot: Secondary | ICD-10-CM | POA: Diagnosis not present

## 2019-03-13 DIAGNOSIS — S93412D Sprain of calcaneofibular ligament of left ankle, subsequent encounter: Secondary | ICD-10-CM | POA: Diagnosis not present

## 2019-03-13 DIAGNOSIS — R262 Difficulty in walking, not elsewhere classified: Secondary | ICD-10-CM | POA: Diagnosis not present

## 2019-03-13 DIAGNOSIS — M722 Plantar fascial fibromatosis: Secondary | ICD-10-CM | POA: Diagnosis not present

## 2019-03-20 DIAGNOSIS — S93412D Sprain of calcaneofibular ligament of left ankle, subsequent encounter: Secondary | ICD-10-CM | POA: Diagnosis not present

## 2019-03-20 DIAGNOSIS — R262 Difficulty in walking, not elsewhere classified: Secondary | ICD-10-CM | POA: Diagnosis not present

## 2019-03-20 DIAGNOSIS — M722 Plantar fascial fibromatosis: Secondary | ICD-10-CM | POA: Diagnosis not present

## 2019-03-20 DIAGNOSIS — M79672 Pain in left foot: Secondary | ICD-10-CM | POA: Diagnosis not present

## 2019-03-27 DIAGNOSIS — S93412D Sprain of calcaneofibular ligament of left ankle, subsequent encounter: Secondary | ICD-10-CM | POA: Diagnosis not present

## 2019-03-27 DIAGNOSIS — M722 Plantar fascial fibromatosis: Secondary | ICD-10-CM | POA: Diagnosis not present

## 2019-03-27 DIAGNOSIS — R262 Difficulty in walking, not elsewhere classified: Secondary | ICD-10-CM | POA: Diagnosis not present

## 2019-03-27 DIAGNOSIS — M79672 Pain in left foot: Secondary | ICD-10-CM | POA: Diagnosis not present

## 2019-07-13 DIAGNOSIS — M6281 Muscle weakness (generalized): Secondary | ICD-10-CM | POA: Diagnosis not present

## 2019-07-13 DIAGNOSIS — M545 Low back pain: Secondary | ICD-10-CM | POA: Diagnosis not present

## 2019-07-13 DIAGNOSIS — G8929 Other chronic pain: Secondary | ICD-10-CM | POA: Diagnosis not present

## 2019-07-18 DIAGNOSIS — M6281 Muscle weakness (generalized): Secondary | ICD-10-CM | POA: Diagnosis not present

## 2019-07-18 DIAGNOSIS — G8929 Other chronic pain: Secondary | ICD-10-CM | POA: Diagnosis not present

## 2019-07-18 DIAGNOSIS — M545 Low back pain: Secondary | ICD-10-CM | POA: Diagnosis not present

## 2019-07-25 DIAGNOSIS — G8929 Other chronic pain: Secondary | ICD-10-CM | POA: Diagnosis not present

## 2019-07-25 DIAGNOSIS — M6281 Muscle weakness (generalized): Secondary | ICD-10-CM | POA: Diagnosis not present

## 2019-07-25 DIAGNOSIS — M545 Low back pain: Secondary | ICD-10-CM | POA: Diagnosis not present

## 2019-07-27 DIAGNOSIS — G8929 Other chronic pain: Secondary | ICD-10-CM | POA: Diagnosis not present

## 2019-07-27 DIAGNOSIS — M545 Low back pain: Secondary | ICD-10-CM | POA: Diagnosis not present

## 2019-07-27 DIAGNOSIS — M6281 Muscle weakness (generalized): Secondary | ICD-10-CM | POA: Diagnosis not present

## 2019-08-08 DIAGNOSIS — G8929 Other chronic pain: Secondary | ICD-10-CM | POA: Diagnosis not present

## 2019-08-08 DIAGNOSIS — M545 Low back pain: Secondary | ICD-10-CM | POA: Diagnosis not present

## 2019-08-08 DIAGNOSIS — M6281 Muscle weakness (generalized): Secondary | ICD-10-CM | POA: Diagnosis not present

## 2019-08-17 DIAGNOSIS — M545 Low back pain: Secondary | ICD-10-CM | POA: Diagnosis not present

## 2019-08-17 DIAGNOSIS — M6281 Muscle weakness (generalized): Secondary | ICD-10-CM | POA: Diagnosis not present

## 2019-08-17 DIAGNOSIS — G8929 Other chronic pain: Secondary | ICD-10-CM | POA: Diagnosis not present

## 2019-08-24 DIAGNOSIS — M6281 Muscle weakness (generalized): Secondary | ICD-10-CM | POA: Diagnosis not present

## 2019-08-24 DIAGNOSIS — M545 Low back pain: Secondary | ICD-10-CM | POA: Diagnosis not present

## 2019-08-24 DIAGNOSIS — G8929 Other chronic pain: Secondary | ICD-10-CM | POA: Diagnosis not present

## 2019-08-31 DIAGNOSIS — M6281 Muscle weakness (generalized): Secondary | ICD-10-CM | POA: Diagnosis not present

## 2019-08-31 DIAGNOSIS — M545 Low back pain: Secondary | ICD-10-CM | POA: Diagnosis not present

## 2019-08-31 DIAGNOSIS — G8929 Other chronic pain: Secondary | ICD-10-CM | POA: Diagnosis not present

## 2019-09-07 DIAGNOSIS — G8929 Other chronic pain: Secondary | ICD-10-CM | POA: Diagnosis not present

## 2019-09-07 DIAGNOSIS — M545 Low back pain: Secondary | ICD-10-CM | POA: Diagnosis not present

## 2019-09-07 DIAGNOSIS — M6281 Muscle weakness (generalized): Secondary | ICD-10-CM | POA: Diagnosis not present

## 2019-09-14 DIAGNOSIS — M6281 Muscle weakness (generalized): Secondary | ICD-10-CM | POA: Diagnosis not present

## 2019-09-14 DIAGNOSIS — M545 Low back pain: Secondary | ICD-10-CM | POA: Diagnosis not present

## 2019-09-14 DIAGNOSIS — G8929 Other chronic pain: Secondary | ICD-10-CM | POA: Diagnosis not present

## 2019-09-29 DIAGNOSIS — M545 Low back pain: Secondary | ICD-10-CM | POA: Diagnosis not present

## 2019-10-05 DIAGNOSIS — M545 Low back pain: Secondary | ICD-10-CM | POA: Diagnosis not present

## 2019-10-05 DIAGNOSIS — G8929 Other chronic pain: Secondary | ICD-10-CM | POA: Diagnosis not present

## 2019-10-05 DIAGNOSIS — M6281 Muscle weakness (generalized): Secondary | ICD-10-CM | POA: Diagnosis not present

## 2020-08-27 ENCOUNTER — Other Ambulatory Visit: Payer: BLUE CROSS/BLUE SHIELD

## 2020-08-27 DIAGNOSIS — Z20822 Contact with and (suspected) exposure to covid-19: Secondary | ICD-10-CM

## 2020-08-28 ENCOUNTER — Encounter (HOSPITAL_COMMUNITY): Payer: Self-pay | Admitting: Obstetrics and Gynecology

## 2020-08-29 LAB — SARS-COV-2, NAA 2 DAY TAT

## 2020-08-29 LAB — NOVEL CORONAVIRUS, NAA: SARS-CoV-2, NAA: NOT DETECTED

## 2020-09-04 NOTE — H&P (Addendum)
Brooke Carroll is an 39 y.o. female. Presents with confirmed embryonic demise at [redacted] weeks ega. [place] 09/04/20 in office confirms demise. Still having pregnancy sxs.  No bleeding or cramping    Past Medical History:  Diagnosis Date  . Anxiety    currently no meds-became panicky during CS  . Depression   . GERD (gastroesophageal reflux disease)    pregnancy related  . Ruptured ovarian cyst 2011  . WPW (Wolff-Parkinson-White syndrome)    as child- ablation age 20    Past Surgical History:  Procedure Laterality Date  . CARDIAC ELECTROPHYSIOLOGY STUDY AND ABLATION  1990   WPW  . CESAREAN SECTION N/A 07/28/2013   Procedure: CESAREAN SECTION; x 1- Surgeon: Juluis Mire, MD;  Location: [facility];  Service: Obstetrics;  Laterality: N/A;  . CESAREAN SECTION N/A 08/15/2015   Procedure: CESAREAN SECTION;  Surgeon: Candice Camp, MD;  Location: [facility];  Service: Obstetrics;  Laterality: N/A;  Repeat edc 08/20/15 NKDA    Family History  Problem Relation Age of Onset  . Cancer Mother   . Hypertension Father   . Heart disease Father   . Obesity Father   . Arthritis Paternal Grandmother     Social History:  reports that she has never smoked. She has never used smokeless tobacco. She reports current alcohol use of about 1.0 standard drink of alcohol per week. She reports that she does not use drugs.  Allergies: No Known Allergies  No medications prior to admission.    Review of Systems  unknown if currently breastfeeding. Physical Exam   CX th high [place] [redacted]weeks EGA/Fetus measuing 8 weeks size.  No FHT  No results found for this or any previous visit (from the past 24 hour(s)).  No results found.  Assessment/Plan: IUP at 10 weeks Embyonic demise/Missed AB - discussed options at length .  She desires to proceed with D&E We discussed the procedure at length.  The risks and benefits and alternative treatments were discussed.  Risks include, but not limited to, injury to bowel, bladder, uterus,  tubes ovaries, risks associated with possible laparotomy, blood transfusion, and infection.  All of her questions were answered and she gives informed consent. She is blood type O+ [name] 09/04/2020, 6:21 PM   This patient has been seen and examined.   All of her questions were answered.  Labs and vital signs reviewed.  Informed consent has been obtained.  The History and Physical is current.

## 2020-09-04 NOTE — Progress Notes (Signed)
Patient denies shortness of breath, fever, cough or chest pain.  PCP - None Cardiologist - n/a  Chest x-ray - n/a EKG - n/a Stress Test - n/a ECHO - n/a Cardiac Cath - n/a  ERAS:  Clears til 9 am DOS, no drink  STOP now taking any Aspirin (unless otherwise instructed by your surgeon), Aleve, Naproxen, Ibuprofen, Motrin, Advil, Goody's, BC's, all herbal medications, fish oil, and all vitamins.   Coronavirus Screening Covid test on DOS Do you have any of the following symptoms:  Cough yes/no: No Fever (>100.62F)  yes/no: No Runny nose yes/no: No Sore throat yes/no: No Difficulty breathing/shortness of breath  yes/no: No  Have you traveled in the last 14 days and where? yes/no: No  Patient verbalized understanding of instructions that were given via phone.

## 2020-09-05 ENCOUNTER — Encounter (HOSPITAL_COMMUNITY): Payer: Self-pay | Admitting: Obstetrics and Gynecology

## 2020-09-05 ENCOUNTER — Ambulatory Visit (HOSPITAL_COMMUNITY): Payer: BC Managed Care – PPO | Admitting: Certified Registered Nurse Anesthetist

## 2020-09-05 ENCOUNTER — Encounter (HOSPITAL_COMMUNITY)
Admission: AD | Disposition: A | Discharge status: Home / Self Care | Payer: Self-pay | Source: Ambulatory Visit | Attending: Obstetrics and Gynecology

## 2020-09-05 ENCOUNTER — Other Ambulatory Visit: Payer: Self-pay

## 2020-09-05 ENCOUNTER — Ambulatory Visit (HOSPITAL_COMMUNITY)
Admission: AD | Admit: 2020-09-05 | Discharge: 2020-09-05 | Disposition: A | Discharge status: Home / Self Care | Payer: BC Managed Care – PPO | Source: Ambulatory Visit | Attending: Obstetrics and Gynecology | Admitting: Obstetrics and Gynecology

## 2020-09-05 DIAGNOSIS — Z20822 Contact with and (suspected) exposure to covid-19: Secondary | ICD-10-CM | POA: Insufficient documentation

## 2020-09-05 DIAGNOSIS — O021 Missed abortion: Secondary | ICD-10-CM | POA: Insufficient documentation

## 2020-09-05 HISTORY — PX: DILATION AND EVACUATION: SHX1459

## 2020-09-05 LAB — CBC
HCT: 39.6 % (ref 36.0–46.0)
Hemoglobin: 13.7 g/dL (ref 12.0–15.0)
MCH: 31.6 pg (ref 26.0–34.0)
MCHC: 34.6 g/dL (ref 30.0–36.0)
MCV: 91.2 fL (ref 80.0–100.0)
Platelets: 273 10*3/uL (ref 150–400)
RBC: 4.34 MIL/uL (ref 3.87–5.11)
RDW: 12.2 % (ref 11.5–15.5)
WBC: 6 10*3/uL (ref 4.0–10.5)
nRBC: 0 % (ref 0.0–0.2)

## 2020-09-05 LAB — TYPE AND SCREEN
ABO/RH(D): O POS
Antibody Screen: NEGATIVE

## 2020-09-05 LAB — SARS CORONAVIRUS 2 BY RT PCR (HOSPITAL ORDER, PERFORMED IN ~~LOC~~ HOSPITAL LAB): SARS Coronavirus 2: NEGATIVE

## 2020-09-05 SURGERY — DILATION AND EVACUATION, UTERUS
Anesthesia: Monitor Anesthesia Care | Site: Vagina

## 2020-09-05 SURGERY — DILATION AND EVACUATION, UTERUS
Anesthesia: General

## 2020-09-05 MED ORDER — SODIUM CHLORIDE 0.9 % IV SOLN
INTRAVENOUS | Status: AC
  Filled 2020-09-05: qty 2

## 2020-09-05 MED ORDER — POVIDONE-IODINE 10 % EX SWAB
2.0000 "application " | Freq: Once | CUTANEOUS | Status: AC
  Administered 2020-09-05: 2 via TOPICAL

## 2020-09-05 MED ORDER — AMISULPRIDE (ANTIEMETIC) 5 MG/2ML IV SOLN: 10.0000 mg | Freq: Once | INTRAVENOUS | Status: DC | PRN

## 2020-09-05 MED ORDER — FENTANYL CITRATE (PF) 100 MCG/2ML IJ SOLN
INTRAMUSCULAR | Status: DC | PRN
Start: 2020-09-05 — End: 2020-09-05
  Administered 2020-09-05: 50 ug via INTRAVENOUS

## 2020-09-05 MED ORDER — LIDOCAINE 2% (20 MG/ML) 5 ML SYRINGE
INTRAMUSCULAR | Status: DC | PRN
  Administered 2020-09-05: 60 mg via INTRAVENOUS

## 2020-09-05 MED ORDER — STERILE WATER FOR IRRIGATION IR SOLN
Status: DC | PRN
  Administered 2020-09-05: 1000 mL

## 2020-09-05 MED ORDER — FENTANYL CITRATE (PF) 250 MCG/5ML IJ SOLN
INTRAMUSCULAR | Status: AC
Start: 2020-09-05 — End: ?
  Filled 2020-09-05: qty 5

## 2020-09-05 MED ORDER — 0.9 % SODIUM CHLORIDE (POUR BTL) OPTIME
TOPICAL | Status: DC | PRN
  Administered 2020-09-05: 1000 mL

## 2020-09-05 MED ORDER — METHYLERGONOVINE MALEATE 0.2 MG/ML IJ SOLN
INTRAMUSCULAR | Status: AC
  Filled 2020-09-05: qty 1

## 2020-09-05 MED ORDER — KETOROLAC TROMETHAMINE 30 MG/ML IJ SOLN
INTRAMUSCULAR | Status: DC | PRN
  Administered 2020-09-05: 30 mg via INTRAVENOUS

## 2020-09-05 MED ORDER — CHLORHEXIDINE GLUCONATE 0.12 % MT SOLN
OROMUCOSAL | Status: AC
  Administered 2020-09-05: 15 mL via OROMUCOSAL
  Filled 2020-09-05: qty 15

## 2020-09-05 MED ORDER — PROPOFOL 10 MG/ML IV BOLUS
INTRAVENOUS | Status: DC | PRN
  Administered 2020-09-05: 100 ug/kg/min via INTRAVENOUS
  Administered 2020-09-05: 20 mg via INTRAVENOUS
  Administered 2020-09-05: 10 mg via INTRAVENOUS

## 2020-09-05 MED ORDER — KETOROLAC TROMETHAMINE 30 MG/ML IJ SOLN
INTRAMUSCULAR | Status: AC
  Filled 2020-09-05: qty 1

## 2020-09-05 MED ORDER — ACETAMINOPHEN 10 MG/ML IV SOLN: 1000.0000 mg | Freq: Once | INTRAVENOUS | Status: DC | PRN

## 2020-09-05 MED ORDER — SODIUM CHLORIDE 0.9 % IV SOLN
2.0000 g | INTRAVENOUS | Status: AC
  Administered 2020-09-05: 2 g via INTRAVENOUS

## 2020-09-05 MED ORDER — ACETAMINOPHEN 160 MG/5ML PO SOLN: 325.0000 mg | Freq: Once | ORAL | Status: DC | PRN

## 2020-09-05 MED ORDER — DEXAMETHASONE SODIUM PHOSPHATE 10 MG/ML IJ SOLN
INTRAMUSCULAR | Status: DC | PRN
  Administered 2020-09-05: 5 mg via INTRAVENOUS

## 2020-09-05 MED ORDER — FENTANYL CITRATE (PF) 100 MCG/2ML IJ SOLN: 25.0000 ug | INTRAMUSCULAR | Status: DC | PRN

## 2020-09-05 MED ORDER — MEPERIDINE HCL 25 MG/ML IJ SOLN: 6.2500 mg | INTRAMUSCULAR | Status: DC | PRN

## 2020-09-05 MED ORDER — PROPOFOL 10 MG/ML IV BOLUS
INTRAVENOUS | Status: AC
  Filled 2020-09-05: qty 20

## 2020-09-05 MED ORDER — ONDANSETRON HCL 4 MG/2ML IJ SOLN
INTRAMUSCULAR | Status: AC
  Filled 2020-09-05: qty 2

## 2020-09-05 MED ORDER — ACETAMINOPHEN 325 MG PO TABS: 325.0000 mg | ORAL_TABLET | Freq: Once | ORAL | Status: DC | PRN

## 2020-09-05 MED ORDER — LIDOCAINE HCL (CARDIAC) PF 100 MG/5ML IV SOSY: PREFILLED_SYRINGE | INTRAVENOUS | Status: DC | PRN

## 2020-09-05 MED ORDER — LACTATED RINGERS IV SOLN: INTRAVENOUS | Status: DC

## 2020-09-05 MED ORDER — ORAL CARE MOUTH RINSE: 15.0000 mL | Freq: Once | OROMUCOSAL | Status: AC

## 2020-09-05 MED ORDER — ONDANSETRON HCL 4 MG/2ML IJ SOLN
INTRAMUSCULAR | Status: DC | PRN
  Administered 2020-09-05: 4 mg via INTRAVENOUS

## 2020-09-05 MED ORDER — CHLORHEXIDINE GLUCONATE 0.12 % MT SOLN: 15.0000 mL | Freq: Once | OROMUCOSAL | Status: AC

## 2020-09-05 MED ORDER — LIDOCAINE-EPINEPHRINE 1 %-1:100000 IJ SOLN
INTRAMUSCULAR | Status: AC
  Filled 2020-09-05: qty 1

## 2020-09-05 MED ORDER — DEXAMETHASONE SODIUM PHOSPHATE 10 MG/ML IJ SOLN
INTRAMUSCULAR | Status: AC
  Filled 2020-09-05: qty 1

## 2020-09-05 MED ORDER — METHYLERGONOVINE MALEATE 0.2 MG/ML IJ SOLN
INTRAMUSCULAR | Status: DC | PRN
  Administered 2020-09-05: .2 mg via INTRAMUSCULAR

## 2020-09-05 MED ORDER — MIDAZOLAM HCL 2 MG/2ML IJ SOLN
INTRAMUSCULAR | Status: AC
  Filled 2020-09-05: qty 2

## 2020-09-05 MED ORDER — LIDOCAINE-EPINEPHRINE 1 %-1:100000 IJ SOLN
INTRAMUSCULAR | Status: DC | PRN
  Administered 2020-09-05: 20 mL

## 2020-09-05 MED ORDER — MIDAZOLAM HCL 5 MG/5ML IJ SOLN
INTRAMUSCULAR | Status: DC | PRN
  Administered 2020-09-05: 2 mg via INTRAVENOUS

## 2020-09-05 SURGICAL SUPPLY — 23 items
CATH ROBINSON RED A/P 16FR (CATHETERS) ×2 IMPLANT
DECANTER SPIKE VIAL GLASS SM (MISCELLANEOUS) ×2 IMPLANT
FILTER UTR ASPR ASSEMBLY (MISCELLANEOUS) ×2 IMPLANT
GAUZE 4X4 16PLY RFD (DISPOSABLE) ×2 IMPLANT
GLOVE BIO SURGEON STRL SZ8 (GLOVE) ×2 IMPLANT
GLOVE BIOGEL PI IND STRL 7.0 (GLOVE) ×1 IMPLANT
GLOVE BIOGEL PI INDICATOR 7.0 (GLOVE) ×1
GLOVE SURG ORTHO 8.0 STRL STRW (GLOVE) ×2 IMPLANT
GOWN STRL REUS W/ TWL LRG LVL3 (GOWN DISPOSABLE) ×2 IMPLANT
GOWN STRL REUS W/TWL LRG LVL3 (GOWN DISPOSABLE) ×2
HOSE CONNECTING 18IN BERKELEY (TUBING) ×2 IMPLANT
KIT BERKELEY 1ST TRI 3/8 NO TR (MISCELLANEOUS) ×2 IMPLANT
KIT BERKELEY 1ST TRIMESTER 3/8 (MISCELLANEOUS) ×2 IMPLANT
NS IRRIG 1000ML POUR BTL (IV SOLUTION) ×2 IMPLANT
PACK VAGINAL MINOR WOMEN LF (CUSTOM PROCEDURE TRAY) ×2 IMPLANT
PAD OB MATERNITY 4.3X12.25 (PERSONAL CARE ITEMS) ×2 IMPLANT
SET BERKELEY SUCTION TUBING (SUCTIONS) ×2 IMPLANT
TOWEL GREEN STERILE FF (TOWEL DISPOSABLE) ×2 IMPLANT
UNDERPAD 30X36 HEAVY ABSORB (UNDERPADS AND DIAPERS) IMPLANT
VACURETTE 10 RIGID CVD (CANNULA) ×2 IMPLANT
VACURETTE 7MM CVD STRL WRAP (CANNULA) IMPLANT
VACURETTE 8 RIGID CVD (CANNULA) IMPLANT
VACURETTE 9 RIGID CVD (CANNULA) IMPLANT

## 2020-09-05 NOTE — Anesthesia Preprocedure Evaluation (Addendum)
Anesthesia Evaluation  Patient identified by MRN, date of birth, ID band Patient awake    Reviewed: Allergy & Precautions, NPO status , Patient's Chart, lab work & pertinent test results  Airway Mallampati: I  TM Distance: >3 FB Neck ROM: Full    Dental  (+) Teeth Intact, Dental Advisory Given   Pulmonary neg pulmonary ROS,    breath sounds clear to auscultation       Cardiovascular negative cardio ROS   Rhythm:Regular Rate:Normal     Neuro/Psych PSYCHIATRIC DISORDERS Anxiety Depression negative neurological ROS     GI/Hepatic Neg liver ROS, GERD  ,  Endo/Other  negative endocrine ROS  Renal/GU negative Renal ROS     Musculoskeletal negative musculoskeletal ROS (+)   Abdominal Normal abdominal exam  (+)   Peds  Hematology negative hematology ROS (+)   Anesthesia Other Findings   Reproductive/Obstetrics                            Anesthesia Physical Anesthesia Plan  ASA: II  Anesthesia Plan: MAC   Post-op Pain Management:    Induction: Intravenous  PONV Risk Score and Plan: 3 and Ondansetron, Midazolam and Propofol infusion  Airway Management Planned: Natural Airway and Simple Face Mask  Additional Equipment: None  Intra-op Plan:   Post-operative Plan:   Informed Consent: I have reviewed the patients History and Physical, chart, labs and discussed the procedure including the risks, benefits and alternatives for the proposed anesthesia with the patient or authorized representative who has indicated his/her understanding and acceptance.     Dental advisory given  Plan Discussed with: CRNA  Anesthesia Plan Comments: (Lab Results      Component                Value               Date                      WBC                      9.4                 08/16/2015                HGB                      10.6 (L)            08/16/2015                HCT                      30.7  (L)            08/16/2015                MCV                      91.4                08/16/2015                PLT                      163  08/16/2015           )      Anesthesia Quick Evaluation

## 2020-09-05 NOTE — Op Note (Signed)
NAME: Brooke Carroll, ROSSA MEDICAL RECORD RK:[id number] ACCOUNT [id number] DATE OF BIRTH:1981-05-03 FACILITY: [facility] LOCATION: [facility] PHYSICIAN:Rashawna Scoles C. Akshara Blumenthal, MD  OPERATIVE REPORT  DATE OF PROCEDURE:  09/05/2020  PREOPERATIVE DIAGNOSES:  Embryonic demise at [redacted] weeks gestational age.  POSTOPERATIVE DIAGNOSES:  Embryonic demise at [redacted] weeks gestational age.  PROCEDURE:  Dilation and evacuation.  SURGEON:  Candice Camp, MD   ANESTHESIA:  Monitored anesthetic care and paracervical block.  INDICATIONS:  The patient is a 39 year old G3 P2 at [redacted] weeks gestational age, who presented yesterday for routine obstetrical visit, unable to hear fetal heart tones.  Ultrasound confirms embryonic demise.  She wishes to proceed with dilation and  evacuation.  The risks and benefits of the procedure were discussed at length and informed consent was obtained.  DESCRIPTION OF PROCEDURE:  After adequate anesthesia, the patient was placed in the dorsal lithotomy position.  She was sterilely prepped and draped.  Bladder was sterilely drained.  Graves speculum was placed, tenaculum placed on the anterior lip of the  cervix.  Paracervical block was placed with 1% Xylocaine with 1:100,000 epinephrine.  Uterus was sounded to 10 cm, easily dilated to a #30 Pratt dilator.  A 10 mm suction curet was inserted.  Suction was begun, retrieving products of conception.   Suction curettage was performed until no further products being retrieved and a gritty surface was felt throughout the entire endometrial cavity.  The patient was then given Methergine 0.2 mg IM with good uterine response.  The curette was removed.   Tenaculum removed from the cervix and the patient was transferred to the recovery room in stable condition.  Sponge and needle count was normal x2.  Estimated blood loss was minimal.  DISPOSITION:  The patient was discharged home with followup in the office.  She was sent out with the routine instruction sheet for   D and E, told to return for increased pain, fever or bleeding and to follow up in the office in 2 to 3 weeks.  VN/NUANCE  D:09/05/2020 T:09/05/2020 JOB:[id number]

## 2020-09-05 NOTE — Transfer of Care (Signed)
Immediate Anesthesia Transfer of Care Note  Patient: Brooke Carroll  Procedure(s) Performed: DILATATION AND EVACUATION (N/A )  Patient Location: PACU  Anesthesia Type:MAC  Level of Consciousness: awake, alert  and oriented  Airway & Oxygen Therapy: Patient Spontanous Breathing  Post-op Assessment: Report given to RN and Post -op Vital signs reviewed and stable  Post vital signs: Reviewed and stable  Last Vitals:  Vitals Value Taken Time  BP 98/53 09/05/20 1226  Temp 36.4 C 09/05/20 1226  Pulse 71 09/05/20 1227  Resp 22 09/05/20 1227  SpO2 99 % 09/05/20 1227  Vitals shown include unvalidated device data.  Last Pain:  Vitals:   09/05/20 1007  TempSrc:   PainSc: 0-No pain      Patients Stated Pain Goal: 3 (21/74/71 5953)  Complications: No complications documented.

## 2020-09-05 NOTE — Anesthesia Postprocedure Evaluation (Signed)
Anesthesia Post Note  Patient: Brooke Carroll  Procedure(s) Performed: DILATATION AND EVACUATION (N/A Vagina )     Patient location during evaluation: PACU Anesthesia Type: MAC Level of consciousness: awake and alert Pain management: pain level controlled Vital Signs Assessment: post-procedure vital signs reviewed and stable Respiratory status: spontaneous breathing, nonlabored ventilation, respiratory function stable and patient connected to nasal cannula oxygen Cardiovascular status: stable and blood pressure returned to baseline Postop Assessment: no apparent nausea or vomiting Anesthetic complications: no   No complications documented.  Last Vitals:  Vitals:   09/05/20 1240 09/05/20 1245  BP: (!) 108/57 (!) 104/52  Pulse: 72 (!) 49  Resp: 20 15  Temp:  (!) 36.4 C  SpO2: 96% 100%    Last Pain:  Vitals:   09/05/20 1245  TempSrc:   PainSc: 0-No pain                 Effie Berkshire

## 2020-09-05 NOTE — Anesthesia Procedure Notes (Signed)
Procedure Name: MAC Date/Time: 09/05/2020 12:01 PM Performed by: Dorthea Cove, CRNA Pre-anesthesia Checklist: Patient identified, Emergency Drugs available, Suction available and Patient being monitored Patient Re-evaluated:Patient Re-evaluated prior to induction Oxygen Delivery Method: Simple face mask

## 2020-09-06 ENCOUNTER — Encounter (HOSPITAL_COMMUNITY): Payer: Self-pay | Admitting: Obstetrics and Gynecology

## 2020-09-06 LAB — SURGICAL PATHOLOGY

## 2020-10-08 ENCOUNTER — Ambulatory Visit: Payer: BC Managed Care – PPO | Attending: Internal Medicine

## 2020-10-08 DIAGNOSIS — Z23 Encounter for immunization: Secondary | ICD-10-CM

## 2020-10-08 NOTE — Progress Notes (Signed)
   Covid-19 Vaccination Clinic  Name:  Brooke Carroll    MRN: [id number] DOB: March 20, 1981  10/08/2020  Ms. [name] was observed post Covid-19 immunization for 15 minutes without incident. She was provided with Vaccine Information Sheet and instruction to access the V-Safe system.   Ms. [name] was instructed to call 911 with any severe reactions post vaccine: Marland Kitchen Difficulty breathing  . Swelling of face and throat  . A fast heartbeat  . A bad rash all over body  . Dizziness and weakness

## 2020-12-07 NOTE — L&D Delivery Note (Signed)
   Delivery Note:   [id number] at [redacted]w[redacted]d  Admitting diagnosis: Normal labor [O80, Z37.9] Risks:  1. Hx C/S x 2, G1 arrest 9 cm and chorio, double layer closure; G2, scheduled repeat.  2. AMA 40 yo, ANFT weekly after 36 wks reassuring 3. 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  First Stage:  Induction of labor:Outpatient cervical balloon placed 09/08/21 and expelled the next day, membrane sweep on 09/11/21 Onset of labor: 09/16/21 at 0700 Augmentation: N/A ROM: SROM 09/16/21 at 10 am, clear fluid Active labor onset: 10 am Analgesia /Anesthesia/Pain control intrapartum: IV Fentanyl, continuous labor support, Local   Second Stage:  Complete dilation at 09/16/2021  1530 Onset of pushing at 1525, pushed through small anterior lip with CNM assist FHR second stage category 2 with variables to 90 bpm, then prolonged decels to 90 BPM for the last 10 min with intermittent return to BL of 120 bpm. Dr. Ernestina Penna in unit on stand by and consulted for expediting delivery via vacuum.  Patient counseled on R/B of vacuum and agrees.  Please see Dr. Elpidio Eric note for details of newborn delivery.    Pushing in L and R lateral positions, then recumbent for last stage and delivery with CNM and L&D staff support, [name] FOB and [name] doula present for birth and supportive. Nuchal Cord: Yes  Delivery of a Live born female  Birth Weight:  pending APGAR: 5, 8  Newborn Delivery   Birth date/time: 09/16/2021 15:53:00 Delivery type: VBAC, Vacuum Assisted      in cephalic presentation, position OA to ROT. APGAR:10 min- 9  Collection of cord blood for typing completed. Cord blood donation-None  Arterial cord blood sample-Yes  7.04 pH  Patient care resumed by CNM after newborn delivery.    Third Stage:  Placenta delivered-  spontaneous, complete and intact with 3 vessels . Uterine tone firm, bleeding minimal Uterotonics: Pitocin IV bolus Placenta to 1% L&D for disposal.  2nd degree;Perineal;Sulcus   laceration identified.  Episiotomy:None  Local analgesia: 1% lido  Repair:2.0 vicryl in standard fashion, good approximation and hemostasis Est. Blood Loss (mL):400.00   Complications: None   Mom to postpartum.  Meda Coffee to Couplet care / Skin to Skin. Baby and mother bonding well in L&D room, lactation consultant at [facility] to assist with latch.   Delivery Report:  Review the Delivery Report for details.     Signed: Neta Mends, CNM, MSN 09/16/2021, 4:44 PM

## 2021-05-27 LAB — OB RESULTS CONSOLE GC/CHLAMYDIA
Chlamydia: NEGATIVE
Gonorrhea: NEGATIVE

## 2021-05-27 LAB — OB RESULTS CONSOLE RUBELLA ANTIBODY, IGM: Rubella: IMMUNE

## 2021-05-27 LAB — OB RESULTS CONSOLE RPR: RPR: NONREACTIVE

## 2021-05-27 LAB — OB RESULTS CONSOLE HEPATITIS B SURFACE ANTIGEN: Hepatitis B Surface Ag: NEGATIVE

## 2021-05-27 LAB — OB RESULTS CONSOLE ABO/RH: RH Type: POSITIVE

## 2021-05-27 LAB — OB RESULTS CONSOLE HIV ANTIBODY (ROUTINE TESTING): HIV: NONREACTIVE

## 2021-05-27 LAB — OB RESULTS CONSOLE ANTIBODY SCREEN: Antibody Screen: NEGATIVE

## 2021-08-25 LAB — OB RESULTS CONSOLE GBS: GBS: NEGATIVE

## 2021-09-09 ENCOUNTER — Other Ambulatory Visit: Payer: Self-pay | Admitting: Obstetrics & Gynecology

## 2021-09-10 ENCOUNTER — Encounter (HOSPITAL_COMMUNITY): Payer: Self-pay | Admitting: Obstetrics & Gynecology

## 2021-09-10 ENCOUNTER — Telehealth (HOSPITAL_COMMUNITY): Payer: Self-pay | Admitting: *Deleted

## 2021-09-10 NOTE — Telephone Encounter (Signed)
Preadmission screen  

## 2021-09-11 ENCOUNTER — Encounter (HOSPITAL_COMMUNITY): Payer: Self-pay

## 2021-09-11 ENCOUNTER — Telehealth (HOSPITAL_COMMUNITY): Payer: Self-pay | Admitting: *Deleted

## 2021-09-11 NOTE — Patient Instructions (Addendum)
Vy Badley  09/11/2021   Your procedure is scheduled on:  09/17/2021  Arrive at 2:45 at [facility] on [address] at [facility]  and [employer]. You are invited to use the FREE valet parking or use the Visitor's parking deck.  Pick up the phone at the desk and dial [phone].  Call this number if you have problems the morning of surgery: [phone]  Remember:   Do not eat food:(After Midnight) Desps de medianoche.  Do not drink clear liquids: (6 Hours before arrival) 6 horas ante llegada.  Take these medicines the morning of surgery with A SIP OF WATER:  none   Do not wear jewelry, make-up or nail polish.  Do not wear lotions, powders, or perfumes. Do not wear deodorant.  Do not shave 48 hours prior to surgery.  Do not bring valuables to the hospital.  [facility] is not   responsible for any belongings or valuables brought to the hospital.  Contacts, dentures or bridgework may not be worn into surgery.  Leave suitcase in the car. After surgery it may be brought to your room.  For patients admitted to the hospital, checkout time is 11:00 AM the day of              discharge.      Please read over the following fact sheets that you were given:     Preparing for Surgery

## 2021-09-11 NOTE — Telephone Encounter (Signed)
Preadmission screen  

## 2021-09-15 ENCOUNTER — Encounter (HOSPITAL_COMMUNITY)
Admission: RE | Admit: 2021-09-15 | Discharge: 2021-09-15 | Disposition: A | Discharge status: Home / Self Care | Payer: 59 | Source: Ambulatory Visit | Attending: Obstetrics & Gynecology | Admitting: Obstetrics & Gynecology

## 2021-09-15 NOTE — Progress Notes (Signed)
Pt called to see if she would be able to come for PAT testing.  She stated she would need to reschedule and that she really did not want to have her surgery on the scheduled day either.  Hughes Better is the surgery scheduler for [employer] and she was contacted to notify of pt wishes to see if her surgery date could be rescheduled.

## 2021-09-15 NOTE — Pre-Procedure Instructions (Deleted)
Pt called to see if she would be able to come for PAT testing.  She stated she would need to reschedule and that she really did not want to have her surgery on the scheduled day either.  Shanelle is the surgery scheduler for [facility] and she was contacted to notify of pt wishes to see if her surgery date could be rescheduled.   

## 2021-09-16 ENCOUNTER — Inpatient Hospital Stay (HOSPITAL_COMMUNITY)
Admission: AD | Admit: 2021-09-16 | Discharge: 2021-09-17 | DRG: 806 | Disposition: A | Discharge status: Home / Self Care | Payer: 59 | Attending: Obstetrics | Admitting: Obstetrics

## 2021-09-16 ENCOUNTER — Encounter (HOSPITAL_COMMUNITY): Payer: Self-pay | Admitting: Obstetrics

## 2021-09-16 DIAGNOSIS — O9081 Anemia of the puerperium: Secondary | ICD-10-CM | POA: Diagnosis not present

## 2021-09-16 DIAGNOSIS — Z3A40 40 weeks gestation of pregnancy: Secondary | ICD-10-CM | POA: Diagnosis not present

## 2021-09-16 DIAGNOSIS — Z8759 Personal history of other complications of pregnancy, childbirth and the puerperium: Secondary | ICD-10-CM

## 2021-09-16 DIAGNOSIS — D62 Acute posthemorrhagic anemia: Secondary | ICD-10-CM | POA: Diagnosis not present

## 2021-09-16 DIAGNOSIS — O872 Hemorrhoids in the puerperium: Secondary | ICD-10-CM | POA: Diagnosis not present

## 2021-09-16 DIAGNOSIS — Z20822 Contact with and (suspected) exposure to covid-19: Secondary | ICD-10-CM | POA: Diagnosis present

## 2021-09-16 DIAGNOSIS — O34219 Maternal care for unspecified type scar from previous cesarean delivery: Secondary | ICD-10-CM | POA: Diagnosis present

## 2021-09-16 LAB — RESP PANEL BY RT-PCR (FLU A&B, COVID) ARPGX2
Influenza A by PCR: NEGATIVE
Influenza B by PCR: NEGATIVE
SARS Coronavirus 2 by RT PCR: NEGATIVE

## 2021-09-16 LAB — CBC
HCT: 41.8 % (ref 36.0–46.0)
Hemoglobin: 14.9 g/dL (ref 12.0–15.0)
MCH: 32.3 pg (ref 26.0–34.0)
MCHC: 35.6 g/dL (ref 30.0–36.0)
MCV: 90.5 fL (ref 80.0–100.0)
Platelets: 278 10*3/uL (ref 150–400)
RBC: 4.62 MIL/uL (ref 3.87–5.11)
RDW: 13.2 % (ref 11.5–15.5)
WBC: 20.7 10*3/uL — ABNORMAL HIGH (ref 4.0–10.5)
nRBC: 0 % (ref 0.0–0.2)

## 2021-09-16 LAB — TYPE AND SCREEN
ABO/RH(D): O POS
Antibody Screen: NEGATIVE

## 2021-09-16 MED ORDER — OXYTOCIN-SODIUM CHLORIDE 30-0.9 UT/500ML-% IV SOLN
2.5000 [IU]/h | INTRAVENOUS | Status: DC
  Administered 2021-09-16: 2.5 [IU]/h via INTRAVENOUS
  Filled 2021-09-16: qty 500

## 2021-09-16 MED ORDER — ACETAMINOPHEN 500 MG PO TABS
1000.0000 mg | ORAL_TABLET | Freq: Four times a day (QID) | ORAL | Status: DC
  Administered 2021-09-16 – 2021-09-17 (×4): 1000 mg via ORAL
  Filled 2021-09-16 (×4): qty 2

## 2021-09-16 MED ORDER — BENZOCAINE-MENTHOL 20-0.5 % EX AERO
1.0000 "application " | INHALATION_SPRAY | CUTANEOUS | Status: DC | PRN
  Administered 2021-09-16: 1 via TOPICAL
  Filled 2021-09-16: qty 56

## 2021-09-16 MED ORDER — COCONUT OIL OIL: 1.0000 "application " | TOPICAL_OIL | Status: DC | PRN

## 2021-09-16 MED ORDER — OXYCODONE-ACETAMINOPHEN 5-325 MG PO TABS: 2.0000 | ORAL_TABLET | ORAL | Status: DC | PRN

## 2021-09-16 MED ORDER — IBUPROFEN 800 MG PO TABS
800.0000 mg | ORAL_TABLET | Freq: Once | ORAL | Status: AC
  Administered 2021-09-16: 800 mg via ORAL
  Filled 2021-09-16: qty 1

## 2021-09-16 MED ORDER — PRENATAL MULTIVITAMIN CH
1.0000 | ORAL_TABLET | Freq: Every day | ORAL | Status: DC
  Administered 2021-09-17: 1 via ORAL
  Filled 2021-09-16: qty 1

## 2021-09-16 MED ORDER — IBUPROFEN 600 MG PO TABS
600.0000 mg | ORAL_TABLET | Freq: Four times a day (QID) | ORAL | Status: DC
  Administered 2021-09-16 – 2021-09-17 (×4): 600 mg via ORAL
  Filled 2021-09-16 (×4): qty 1

## 2021-09-16 MED ORDER — FENTANYL CITRATE (PF) 100 MCG/2ML IJ SOLN
100.0000 ug | Freq: Once | INTRAMUSCULAR | Status: AC
  Administered 2021-09-16: 100 ug via INTRAVENOUS
  Filled 2021-09-16: qty 2

## 2021-09-16 MED ORDER — SOD CITRATE-CITRIC ACID 500-334 MG/5ML PO SOLN: 30.0000 mL | ORAL | Status: DC | PRN

## 2021-09-16 MED ORDER — FLEET ENEMA 7-19 GM/118ML RE ENEM: 1.0000 | ENEMA | Freq: Every day | RECTAL | Status: DC | PRN

## 2021-09-16 MED ORDER — ONDANSETRON HCL 4 MG PO TABS: 4.0000 mg | ORAL_TABLET | ORAL | Status: DC | PRN

## 2021-09-16 MED ORDER — DIPHENHYDRAMINE HCL 25 MG PO CAPS: 25.0000 mg | ORAL_CAPSULE | Freq: Four times a day (QID) | ORAL | Status: DC | PRN

## 2021-09-16 MED ORDER — ACETAMINOPHEN 325 MG PO TABS: 650.0000 mg | ORAL_TABLET | ORAL | Status: DC | PRN

## 2021-09-16 MED ORDER — ZOLPIDEM TARTRATE 5 MG PO TABS: 5.0000 mg | ORAL_TABLET | Freq: Every evening | ORAL | Status: DC | PRN

## 2021-09-16 MED ORDER — LACTATED RINGERS IV SOLN: INTRAVENOUS | Status: DC

## 2021-09-16 MED ORDER — OXYCODONE HCL 5 MG PO TABS: 5.0000 mg | ORAL_TABLET | ORAL | Status: DC | PRN

## 2021-09-16 MED ORDER — OXYCODONE-ACETAMINOPHEN 5-325 MG PO TABS: 1.0000 | ORAL_TABLET | ORAL | Status: DC | PRN

## 2021-09-16 MED ORDER — LACTATED RINGERS IV SOLN: 500.0000 mL | INTRAVENOUS | Status: DC | PRN

## 2021-09-16 MED ORDER — TETANUS-DIPHTH-ACELL PERTUSSIS 5-2.5-18.5 LF-MCG/0.5 IM SUSY: 0.5000 mL | PREFILLED_SYRINGE | Freq: Once | INTRAMUSCULAR | Status: DC

## 2021-09-16 MED ORDER — LIDOCAINE HCL (PF) 1 % IJ SOLN
30.0000 mL | INTRAMUSCULAR | Status: AC | PRN
  Administered 2021-09-16: 30 mL via SUBCUTANEOUS
  Filled 2021-09-16: qty 30

## 2021-09-16 MED ORDER — SIMETHICONE 80 MG PO CHEW
80.0000 mg | CHEWABLE_TABLET | ORAL | Status: DC | PRN
  Administered 2021-09-16 – 2021-09-17 (×6): 80 mg via ORAL
  Filled 2021-09-16 (×6): qty 1

## 2021-09-16 MED ORDER — ONDANSETRON HCL 4 MG/2ML IJ SOLN: 4.0000 mg | INTRAMUSCULAR | Status: DC | PRN

## 2021-09-16 MED ORDER — SENNOSIDES-DOCUSATE SODIUM 8.6-50 MG PO TABS
2.0000 | ORAL_TABLET | ORAL | Status: DC
  Administered 2021-09-17: 2 via ORAL
  Filled 2021-09-16: qty 2

## 2021-09-16 MED ORDER — ONDANSETRON HCL 4 MG/2ML IJ SOLN: 4.0000 mg | Freq: Four times a day (QID) | INTRAMUSCULAR | Status: DC | PRN

## 2021-09-16 MED ORDER — OXYTOCIN BOLUS FROM INFUSION
333.0000 mL | Freq: Once | INTRAVENOUS | Status: AC
  Administered 2021-09-16: 333 mL via INTRAVENOUS

## 2021-09-16 MED ORDER — DIBUCAINE (PERIANAL) 1 % EX OINT: 1.0000 "application " | TOPICAL_OINTMENT | CUTANEOUS | Status: DC | PRN

## 2021-09-16 MED ORDER — BISACODYL 10 MG RE SUPP
10.0000 mg | Freq: Every day | RECTAL | Status: DC | PRN
  Filled 2021-09-16: qty 1

## 2021-09-16 MED ORDER — WITCH HAZEL-GLYCERIN EX PADS: 1.0000 "application " | MEDICATED_PAD | CUTANEOUS | Status: DC | PRN

## 2021-09-16 NOTE — Lactation Note (Signed)
This note was copied from a baby's chart. Lactation Consultation Note  Patient Name: Brooke Carroll Date: 09/16/2021 Reason for consult: L&D Initial assessment;Term Difficult latch Age:41 hours  LC in to assist with first feeding in L&D.  Baby lying STS on Mom's chest.  Baby crying.  CNM stated that Mom will need a nipple shield.    Reviewed breast massage and hand expression, drop of colostrum expressed.  Mom with short nipples/semi flat nipples.  Mom reminded of importance of baby latching deeply onto areola.    Baby opening his mouth and latching for a few sucks, stopping and starting again.  Baby came off the breast crying.  LC sandwiched areola to help baby latch deeper.  Baby latched to both breasts.  Mom concerned about her left nipple having previously been pierced.  On hand expression, drop of colostrum noted on side of nipple.   LC left baby sleepy STS on Mom's chest. Mom aware of assistance on MBU.  Mom will need a hand pump and possibly a DEBP set up due to history of low milk supply with first two babies second to difficult latches  Maternal Data Has patient been taught Hand Expression?: Yes Does the patient have breastfeeding experience prior to this delivery?: Yes How long did the patient breastfeed?: 3 months/difficult latch  Feeding Mother's Current Feeding Choice: Breast Milk  LATCH Score Latch: Repeated attempts needed to sustain latch, nipple held in mouth throughout feeding, stimulation needed to elicit sucking reflex.  Audible Swallowing: None  Type of Nipple: Flat (short nipple shafts)  Comfort (Breast/Nipple): Soft / non-tender  Hold (Positioning): Full assist, staff holds infant at breast  LATCH Score: 4   Interventions Interventions: Breast feeding basics reviewed;Assisted with latch;Skin to skin;Breast massage;Hand express;Pre-pump if needed;Adjust position;Support pillows;Position options  Consult Status Consult Status: Follow-up from  L&D Date: 09/16/21 Follow-up type: In-patient    Judee Clara 09/16/2021, 4:55 PM

## 2021-09-16 NOTE — MAU Note (Signed)
.  Brooke Carroll is a 40 y.o. at [redacted]w[redacted]d here in MAU reporting: SROM at 1300. Having some bloody show. +FM. GBS neg. Ctx since 0800.   Pain score: 10

## 2021-09-16 NOTE — Lactation Note (Signed)
This note was copied from a baby's chart. Lactation Consultation Note  Patient Name: Brooke Carroll WIOXB'D Date: 09/16/2021 Reason for consult: Initial assessment;Mother's request;Difficult latch;Term Age:41 hours  LC assisted with latching on left side, harder latch for mom in the past.  Mom to pre pump with manual before latching.   Plan 1. To feed based on cues 8-12x 24 her period. Mom to offer breasts in cross cradle in prone position.  2. Mom to offer EBM via spoon or curve tip if not able to latch. 3. I and O sheet reviewed.  All questions answered at end of visit.    Maternal Data Has patient been taught Hand Expression?: Yes  Feeding Mother's Current Feeding Choice: Breast Milk  LATCH Score Latch: Repeated attempts needed to sustain latch, nipple held in mouth throughout feeding, stimulation needed to elicit sucking reflex.  Audible Swallowing: Spontaneous and intermittent  Type of Nipple: Flat (short shafted but will erect with stimulation)  Comfort (Breast/Nipple): Soft / non-tender (Mom started with pain but in prone position resolved at end of the feeding. Mom need to check ensure bottom lip flanged out in midst of feeding.)  Hold (Positioning): Assistance needed to correctly position infant at breast and maintain latch.  LATCH Score: 7   Lactation Tools Discussed/Used Tools: Pump;Flanges Flange Size: 24;27 Breast pump type: Manual Pump Education: Setup, frequency, and cleaning;Milk Storage Reason for Pumping: elongate nipples Pumping frequency: pre pump for 5 -10 min  Interventions Interventions: Breast feeding basics reviewed;Support pillows;Education;Assisted with latch;Position options;Skin to skin;Expressed milk;Breast massage;Hand express;Pre-pump if needed;Hand pump;Breast compression;Adjust position  Discharge Pump: Personal  Consult Status Consult Status: Follow-up Date: 09/17/21 Follow-up type: In-patient    Brooke Carroll   Brooke Carroll 09/16/2021, 9:33 PM

## 2021-09-16 NOTE — H&P (Signed)
OB ADMISSION/ HISTORY & PHYSICAL:  Admission Date: 09/16/2021  1:55 PM  Admit Diagnosis: Normal labor [O80, Z37.9]    Brooke Carroll is a 40 y.o. female presenting for TOLAC. Onset of ctx at 0700. SROM 1300 clear fluid, copious amount, small bloody show.  S/P cervical balloon in office last week and F/U ,membrane sweep.  Contractions painful and q 2 min since ROM, breathing hard and vocalizing w/ ctx. Desires natural labor, spouse [name] present for support along with doula, [name].  Expecting baby boy [name].  Prenatal History: [id number]   EDC : 09/11/2021, by Other Basis  Prenatal care at [facility] since 23 wks, TOC from [facility] for TOLAC and CNM care, Primary Renae Fickle, CNM   Prenatal course complicated by: 1. Hx C/S x 2, G1 arrest 9 cm and chorio, double layer closure; G2, scheduled repeat.  2. AMA 40 yo, ANFT weekly after 36 wks reassuring 3. 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  Prenatal Labs: ABO, Rh: O (06/21 0000)  Antibody: Negative (06/21 0000) Rubella: Immune (06/21 0000)  RPR: Nonreactive (06/21 0000)  HBsAg: Negative (06/21 0000)  HIV: Non-reactive (06/21 0000)  GBS: Negative/-- (09/19 0000)  1 hr Glucola : 176, 3GTT wnl Genetic Screening: normal NIPT Ultrasound: normal anatomy, XY,    Maternal Diabetes: No Genetic Screening: Normal Maternal Ultrasounds/Referrals: Normal Fetal Ultrasounds or other Referrals:  None Maternal Substance Abuse:  No Significant Maternal Medications:  None Significant Maternal Lab Results:  Group B Strep negative Other Comments:  None  Medical / Surgical History :  Past medical history:  Past Medical History:  Diagnosis Date   Anxiety    currently no meds-became panicky during CS   Depression    GERD (gastroesophageal reflux disease)    pregnancy related   Ruptured ovarian cyst 2011   WPW (Wolff-Parkinson-White syndrome)    as child- ablation age 40     Past surgical history:  Past Surgical History:   Procedure Laterality Date   CARDIAC ELECTROPHYSIOLOGY STUDY AND ABLATION  1990   WPW   CESAREAN SECTION N/A 07/28/2013   Procedure: CESAREAN SECTION; x 1- Surgeon: Juluis Mire, MD;  Location: [facility];  Service: Obstetrics;  Laterality: N/A;   CESAREAN SECTION N/A 08/15/2015   Procedure: CESAREAN SECTION;  Surgeon: Candice Camp, MD;  Location: [facility];  Service: Obstetrics;  Laterality: N/A;  Repeat edc 08/20/15 NKDA   DILATION AND EVACUATION N/A 09/05/2020   Procedure: DILATATION AND EVACUATION;  Surgeon: Candice Camp, MD;  Location: [facility] OR;  Service: Gynecology;  Laterality: N/A;     Family History:  Family History  Problem Relation Age of Onset   Cancer Mother    Hypertension Father    Heart disease Father    Obesity Father    Arthritis Paternal Grandmother      Social History:  reports that she has never smoked. She has never used smokeless tobacco. She reports current alcohol use of about 1.0 standard drink per week. She reports that she does not use drugs.  Allergies: Patient has no known allergies.   Current Medications at time of admission:  Medications Prior to Admission  Medication Sig Dispense Refill Last Dose   Prenatal Vit-Fe Fumarate-FA (PRENATAL PO) Take 1 tablet by mouth daily.   09/15/2021   acetaminophen (TYLENOL) 325 MG tablet Take 650 mg by mouth every 6 (six) hours as needed for moderate pain or headache.        Review of Systems: ROS As  noted above Physical Exam: Vital signs and nursing notes reviewed.  Patient Vitals for the past 24 hrs:  BP Temp Temp src Pulse Resp  09/16/21 1437 127/64 -- -- 80 --  09/16/21 1436 -- 98.6 F (37 C) Axillary -- 18     General: AAO x 3, NAD, coping adequate Heart: RRR Lungs:CTAB Abdomen: Gravid, NT, Leopold's vertex, fetal spine to maternal L Extremities: no edema Genitalia / VE: Dilation: 6 Effacement (%): 80 Station: -2 Presentation: Vertex Exam by:: Dorisann Frames, CNM   FHR: 120 BPM, mod variability, + accels,  mild variable decels TOCO: Ctx 2 min, palp mod/strong  Labs:   Pending T&S, CBC, RPR  No results for input(s): WBC, HGB, HCT, PLT in the last 72 hours.   Assessment/Plan:  40 y.o. [id number] at [redacted]w[redacted]d  Fetal wellbeing - FHT category 1 EFW 8 lbs, AGA  Labor: active, good progression since last exam in office yesterday 3-4/50/-2 Expectant management, low threshold for arrest of dilation or descent d/t hx C/S x 2, has been counseled in office regarding this extensively  GBS neg Rubella imm Rh pos  Pain control: desires natural birth, open to epidural as needed Analgesia/anesthesia PRN Fentanyl IV 100 mcg now Doula and spouse at [facility] for continuous labor support  Anticipated MOD: guarded for TOLAC  Plans to breastfeed, ? circumcision. POC discussed with patient and support team, all questions answered.  Dr Ernestina Penna notified of admission / plan of care   Neta Mends CNM, MSN 09/16/2021, 2:47 PM

## 2021-09-16 NOTE — Progress Notes (Signed)
Called by CNM to assess for possible vacuum-assisted vaginal delivery  40 year old G5 P2-0-2-2 with 2 prior cesarean sections here in active spontaneous labor after outpatient cervical Foley balloon and membrane sweep.  Patient with history of primary cesarean section for arrest of labor at 9 cm with chorioamnionitis.  Patient scheduled for repeat C-section and has been motivated for trial of labor after cesarean section with this pregnancy.  Patient with rupture of membranes at 1 AM today after onset of contractions at 7 AM.  Patient progressed to 10 cm overall several hours in the hospital.  Patient now 10 cm dilated 1% effaced with the vertex at 0 station but pushes to +2.  Baby will terminal bradycardia.  D cells to the 90s over the last 10 minutes with intermittent improvements in fetal baseline between contractions impression.  After heart rate was in the 90s for about 10 minutes exam was done which felt there was adequate room in the patient's pelvis and baby was in OA position with vertex at +2 with pushing.  Risk benefits of vacuum-assisted vaginal delivery was discussed with patient including bleeding, vaginal trauma, infection, cephalohematoma and intracranial hemorrhage, failure to achieve vaginal delivery and need for urgent cesarean section.  Patient excepted these risks and agreed to proceed.  With her next contraction and pushing to the +2 station a vacuum Kiwi, was applied to the fetal vertex and gentle downward pressure was applied.  The vacuum was not placed until the third push of a contraction.  Good fetal descent was noted and the vacuum was removed between contractions.  On the next contraction the Kiwi vacuum was used for about 10 seconds with each of 3 pushes which brought the vertex to the introitus.  On the third push there was a pop-off of the vacuum.  With the next contraction the vacuum was again applied but popped off rather quickly.  However at this point delivery of the  vertex was occurring and patient was encouraged to push for delivery.  Nuchal x1 was reduced and the shoulders were delivered without complication.  Baby was pale and limp with no immediate cry.  Baby had cried about 30 seconds but remained pale and limp and the cord was clamped and cut and infant was handed off to the pediatricians.  Cord gas was sent which returned at 7.04.  Remainder of the delivery was handed off to Reina Fuse, CNM for repair of vaginal lacerations and delivery of the placenta.  Lendon Colonel 09/16/2021 4:39 PM

## 2021-09-17 ENCOUNTER — Encounter (HOSPITAL_COMMUNITY): Admission: RE | Payer: Self-pay | Source: Home / Self Care

## 2021-09-17 ENCOUNTER — Inpatient Hospital Stay (HOSPITAL_COMMUNITY): Admission: RE | Admit: 2021-09-17 | Payer: 59 | Source: Home / Self Care | Admitting: Obstetrics & Gynecology

## 2021-09-17 DIAGNOSIS — D62 Acute posthemorrhagic anemia: Secondary | ICD-10-CM

## 2021-09-17 LAB — RPR: RPR Ser Ql: NONREACTIVE

## 2021-09-17 LAB — CBC
HCT: 31.4 % — ABNORMAL LOW (ref 36.0–46.0)
Hemoglobin: 10.9 g/dL — ABNORMAL LOW (ref 12.0–15.0)
MCH: 31.9 pg (ref 26.0–34.0)
MCHC: 34.7 g/dL (ref 30.0–36.0)
MCV: 91.8 fL (ref 80.0–100.0)
Platelets: 242 10*3/uL (ref 150–400)
RBC: 3.42 MIL/uL — ABNORMAL LOW (ref 3.87–5.11)
RDW: 13.4 % (ref 11.5–15.5)
WBC: 15.5 10*3/uL — ABNORMAL HIGH (ref 4.0–10.5)
nRBC: 0 % (ref 0.0–0.2)

## 2021-09-17 SURGERY — Surgical Case
Anesthesia: Spinal

## 2021-09-17 MED ORDER — SENNOSIDES-DOCUSATE SODIUM 8.6-50 MG PO TABS: 2.0000 | ORAL_TABLET | ORAL | Status: AC

## 2021-09-17 MED ORDER — COCONUT OIL OIL: 1.0000 "application " | TOPICAL_OIL | 0 refills | Status: AC | PRN

## 2021-09-17 MED ORDER — BENZOCAINE-MENTHOL 20-0.5 % EX AERO: 1.0000 "application " | INHALATION_SPRAY | CUTANEOUS | Status: AC | PRN

## 2021-09-17 MED ORDER — IBUPROFEN 600 MG PO TABS: 600.0000 mg | ORAL_TABLET | Freq: Four times a day (QID) | ORAL | 0 refills | Status: AC

## 2021-09-17 MED ORDER — ACETAMINOPHEN 500 MG PO TABS: 1000.0000 mg | ORAL_TABLET | Freq: Four times a day (QID) | ORAL | 0 refills | Status: AC

## 2021-09-17 NOTE — Discharge Instructions (Signed)
Sitz baths 2 times /day with warm water x 1 week May add herbals: 1 ounce dried comfrey leaf* 1 ounce calendula flowers 1 ounce lavender flowers 1/2 ounce dried uva ursi leaves 1/2 ounce witch hazel blossoms (if you can find them) 1/2 ounce dried sage leaf 1/2 cup sea salt Directions: Bring 2 quarts of water to a boil. Turn off heat, and place 1 ounce (approximately 1 large handful) of the above mixed herbs (not the salt) into the pot. Steep, covered, for 30 minutes.  Strain the liquid well with a fine mesh strainer, and discard the herb material. Add 2 quarts of liquid to the tub, along with the 1/2 cup of salt. This medicinal liquid can also be made into compresses and peri-rinses. 

## 2021-09-17 NOTE — Discharge Summary (Addendum)
Postpartum Discharge Summary  Date of Service updated 09/17/21     Patient Name: Brooke Carroll DOB: 02/11/1981 MRN: [id number]  Date of admission: 09/16/2021 Delivery date:09/16/2021  Delivering provider: Aloha Gell  Date of discharge: 09/17/2021  Admitting diagnosis: Normal labor [O80, Z37.9] Intrauterine pregnancy: [redacted]w[redacted]d    Secondary diagnosis:  Principal Problem:   Postpartum care following VBAC/VAVB 10/11 Active Problems:   Status post vacuum-assisted vaginal delivery   Normal labor   VBAC, delivered   Perineal laceration, second degree and L sulcal   Acute blood loss anemia  Additional problems: Hx. Of anxiety and depression, not on meds; remote hx of WPW - s/p ablation, no sequelae     Discharge diagnosis: Term Pregnancy Delivered, VBAC, and Anemia                                              Post partum procedures: none Augmentation: OP Foley Complications: None  Hospital course: Onset of Labor With Vacuum-assisted Vaginal Delivery      40y.o. yo [id number] admitted in Active Labor on 09/16/2021. She is s/p cervical balloon in office last week and f/u membrane sweep. Patient had a precipitous delivery with vacuum assistance due to fetal decelerations. See notes for details.   Membrane Rupture Time/Date: 1:00 PM ,09/16/2021   Delivery Method:VBAC, Vacuum Assisted  Episiotomy: None  Lacerations:  2nd degree;Perineal;Sulcus  Patient had an uncomplicated postpartum course.  She is ambulating, tolerating a regular diet, passing flatus, and urinating well. Patient is discharged home in stable condition on 09/17/21.  Newborn Data: Birth date:09/16/2021  Birth time:3:53 PM  Gender:Female  Living status:Living  Apgars:5 ,8  Weight:3909 g  "Everette"  Magnesium Sulfate received: No BMZ received: No Rhophylac:N/A MMR:N/A T-DaP:Given prenatally Flu: Yes given 09/01/21 Transfusion:No  Physical exam  Vitals:   09/16/21 2325 09/17/21 0317 09/17/21 0752  09/17/21 1318  BP: 134/61 (!) 112/58 (!) 123/52 (!) 114/47  Pulse: 68 76 67 76  Resp: 17 16    Temp: 97.9 F (36.6 C) 98 F (36.7 C) 98.2 F (36.8 C) 98.5 F (36.9 C)  TempSrc: Oral Oral Oral Oral  SpO2: 98% 98% 98%     Alert and oriented X3             Lungs: Clear and unlabored             Heart: regular rate and rhythm / no murmurs             Abdomen: soft, non-tender, non-distended              Fundus: firm, non-tender, U-E             Perineum: well approximated 2nd degree, small hematoma noted proximal to external hemorrhoid             Lochia: appropriate             Extremities: no edema, no calf pain or tenderness Labs: Lab Results  Component Value Date   WBC 15.5 (H) 09/17/2021   HGB 10.9 (L) 09/17/2021   HCT 31.4 (L) 09/17/2021   MCV 91.8 09/17/2021   PLT 242 09/17/2021   CMP Latest Ref Rng & Units 11/08/2010  Glucose 70 - 99 mg/dL -  BUN 6 - 23 mg/dL -  Creatinine 0.4 - 1.2 mg/dL -  Sodium  135 - 145 mEq/L -  Potassium 3.5 - 5.1 mEq/L -  Chloride 96 - 112 mEq/L -  Total Protein 6.0 - 8.3 g/dL 6.4  Total Bilirubin 0.3 - 1.2 mg/dL 0.5  Alkaline Phos 39 - 117 U/L 50  AST 0 - 37 U/L 67(H)  ALT 0 - 35 U/L 114(H)   Flavia Shipper Score: Edinburgh Postnatal Depression Scale Screening Tool 09/17/2021  I have been able to laugh and see the funny side of things. 0  I have looked forward with enjoyment to things. 0  I have blamed myself unnecessarily when things went wrong. 1  I have been anxious or worried for no good reason. 1  I have felt scared or panicky for no good reason. 1  Things have been getting on top of me. 0  I have been so unhappy that I have had difficulty sleeping. 0  I have felt sad or miserable. 1  I have been so unhappy that I have been crying. 1  The thought of harming myself has occurred to me. 0  Edinburgh Postnatal Depression Scale Total 5      After visit meds:  Allergies as of 09/17/2021   No Known Allergies      Medication List      TAKE these medications    acetaminophen 500 MG tablet Commonly known as: TYLENOL Take 2 tablets (1,000 mg total) by mouth every 6 (six) hours.   benzocaine-Menthol 20-0.5 % Aero Commonly known as: DERMOPLAST Apply 1 application topically as needed for irritation (perineal discomfort).   coconut oil Oil Apply 1 application topically as needed.   ibuprofen 600 MG tablet Commonly known as: ADVIL Take 1 tablet (600 mg total) by mouth every 6 (six) hours.   PRENATAL PO Take 1 tablet by mouth daily.   senna-docusate 8.6-50 MG tablet Commonly known as: Senokot-S Take 2 tablets by mouth daily. Start taking on: September 18, 2021               Discharge Care Instructions  (From admission, onward)           Start     Ordered   09/17/21 0000  Discharge wound care:       Comments: Warm water sitz baths 3 times per day as needed   09/17/21 1510             Discharge home in stable condition Infant Feeding: Breast Infant Disposition:home with mother Discharge instruction: per After Visit Summary and Postpartum booklet. Activity: Advance as tolerated. Pelvic rest for 6 weeks.  Diet: low salt diet Anticipated Birth Control:  not discussed Postpartum Appointment:6 weeks Additional Postpartum F/U: Postpartum Depression checkup Future Appointments:No future appointments. Follow up Visit:  Follow-up Information     Juliene Pina, CNM. Schedule an appointment as soon as possible for a visit in 2 week(s).   Specialty: Obstetrics and Gynecology Why: PP visit at 2 weeks if needed, then 6 weeks postpartum Contact information: [address] [place] [address] [phone]                     09/17/2021 Darliss Cheney, CNM

## 2021-09-17 NOTE — Progress Notes (Signed)
CSW received consult for hx of Anxiety and Depression.  CSW met with [name] to offer support and complete assessment.    When CSW arrived, [name] was bonding with infant as evidence by engaging in skin to skin; [name] and infant appeared happy and comfortable. CSW explained CSW's role and inviting [name] to ask questions. [name] was polite, easy to engage, and receptive to meeting with CSW.   CSW asked about [name]'s MH hx and [name] reported that she was dx with anxiety and depression in her teenage years. Per [name], her symptoms have been managed with outpatient therapy over the past 2 years.  [name] reported that she meets with Dr. Meta as needed to address her mental health concerns. [name] denied currently taking any medications for her mental health. [name] also denied having any PMAD symptoms with her older 2 children.   CSW provided education regarding the baby blues period vs. perinatal mood disorders, discussed treatment and gave resources for mental health follow up if concerns arise.  CSW recommends self-evaluation during the postpartum time period using the New Mom Checklist from Postpartum Progress and encouraged [name] to contact a medical professional if symptoms are noted at any time. [name] presented with insight and awareness and denied SI and HI when assessed for safety.  [name] reported having a good support team and communicated feeling comfortable seeking help if needed.    CSW identifies no further need for intervention and no barriers to discharge at this time.   Brooke Carroll, MSW, LCSW Clinical Social Work [phone]  

## 2021-09-17 NOTE — Progress Notes (Signed)
PPD #1, VAVD VBAC, 2nd degree perineal repair and left sulcus, baby boy "Everette"   S:  Reports feeling well, very happy with experience. Does not desires early d/c home today - would prefer to wait until the morning              Tolerating po/ No nausea or vomiting / Denies dizziness or SOB             Bleeding is getting lighter - she did report some clear liquid in her pad once              Pain controlled with Motrin and Tylenol             Up ad lib / ambulatory / voiding QS without difficulty   Newborn breast feeding - going well / Circumcision - plans with D. Renae Fickle, CNM prior to d/c   O:               VS: BP (!) 123/52 (BP Location: Right Arm)   Pulse 67   Temp 98.2 F (36.8 C) (Oral)   Resp 16   SpO2 98%   Breastfeeding Unknown  Patient Vitals for the past 24 hrs:  BP Temp Temp src Pulse Resp SpO2  09/17/21 0752 (!) 123/52 98.2 F (36.8 C) Oral 67 -- 98 %  09/17/21 0317 (!) 112/58 98 F (36.7 C) Oral 76 16 98 %  09/16/21 2325 134/61 97.9 F (36.6 C) Oral 68 17 98 %  09/16/21 1915 121/68 -- Oral 76 17 99 %  09/16/21 1716 123/70 -- -- 81 -- --  09/16/21 1701 135/72 -- -- 91 -- --  09/16/21 1646 124/85 -- -- 92 -- --  09/16/21 1631 132/85 -- -- 88 -- --  09/16/21 1616 (!) 149/99 -- -- -- -- --  09/16/21 1601 128/70 -- -- 87 -- --  09/16/21 1557 136/71 -- -- (!) 104 -- --  09/16/21 1550 -- -- -- -- -- 99 %  09/16/21 1545 -- -- -- -- -- 96 %  09/16/21 1437 127/64 -- -- 80 -- --  09/16/21 1436 -- 98.6 F (37 C) Axillary -- 18 --     LABS:              Recent Labs    09/16/21 1442 09/17/21 0547  WBC 20.7* 15.5*  HGB 14.9 10.9*  PLT 278 242               Blood type: --/--/O POS (10/11 1420)  Rubella: Immune (06/21 0000)                     I&O: Intake/Output      10/11 0701 10/12 0700 10/12 0701 10/13 0700   Urine 100    Blood 400    Total Output 500    Net -500         Urine Occurrence 2 x                  Physical Exam:             Alert and oriented  X3  Lungs: Clear and unlabored  Heart: regular rate and rhythm / no murmurs  Abdomen: soft, non-tender, non-distended              Fundus: firm, non-tender, U-E  Perineum: well approximated 2nd degree, small hematoma noted proximal to external hemorrhoid  Lochia: appropriate  Extremities: no edema, no calf pain or  tenderness    A: PPD # 1, VAVD VBAC 2nd degree perineal repair and L sulcus repair External hemorrhoid - non-thrombosed - encouraged sitz baths and stool softeners, declines medication at this time ABL Anemia - stable, asymptomatic   Doing well - stable status Routine post partum orders  Encouraged to empty bladder every 2-3 hours   Lactation support PRN  Anticipate d/c home tomorrow  Carlean Jews, MSN, CNM [facility]

## 2021-09-17 NOTE — Lactation Note (Signed)
This note was copied from a baby's chart. Lactation Consultation Note  Patient Name: Brooke Carroll [id number] Date: 09/17/2021 Reason for consult: Follow-up assessment Age:41 hours  P3, Mother denies questions or concerns. Reviewed engorgement care and monitoring voids/stools.  Feeding Mother's Current Feeding Choice: Breast Milk   Discharge Discharge Education: Engorgement and breast care;Warning signs for feeding baby  Consult Status Consult Status: Complete Date: 09/17/21 Follow-up type: In-patient    Dahlia Byes [facility] 09/17/2021, 11:34 AM

## 2021-09-29 ENCOUNTER — Telehealth (HOSPITAL_COMMUNITY): Payer: Self-pay | Admitting: *Deleted

## 2021-09-29 NOTE — Telephone Encounter (Signed)
Patient voiced no questions or concerns regarding her own health. EPDS = 3. Patient reported that infant was seen by pediatrician today - diagnosed with RSV. Patient aware of signs to call to MD. Patient voiced no other questions or concerns regarding baby at this time. Patient reported infant sleeps in a bassinet on his back. RN reviewed ABCs of safe sleep - patient verbalized understanding. Deforest Hoyles, RN, 09/29/21, 1558.

## 2024-05-21 ENCOUNTER — Ambulatory Visit: Payer: Self-pay

## 2024-08-29 ENCOUNTER — Encounter (HOSPITAL_COMMUNITY): Payer: Self-pay | Admitting: *Deleted

## 2024-08-29 ENCOUNTER — Ambulatory Visit (HOSPITAL_COMMUNITY): Admission: EM | Admit: 2024-08-29 | Discharge: 2024-08-29 | Disposition: A | Discharge status: Home / Self Care

## 2024-08-29 DIAGNOSIS — M5413 Radiculopathy, cervicothoracic region: Secondary | ICD-10-CM

## 2024-08-29 DIAGNOSIS — S161XXA Strain of muscle, fascia and tendon at neck level, initial encounter: Secondary | ICD-10-CM | POA: Diagnosis not present

## 2024-08-29 MED ORDER — CYCLOBENZAPRINE HCL 10 MG PO TABS: 10.0000 mg | ORAL_TABLET | Freq: Two times a day (BID) | ORAL | 0 refills | Status: AC | PRN

## 2024-08-29 MED ORDER — PREDNISONE 10 MG PO TABS: ORAL_TABLET | ORAL | 0 refills | Status: AC

## 2024-08-29 NOTE — Discharge Instructions (Addendum)
 Please follow up with PCP, may need physical therapy referral. Take meds as directed(prednisone , flexeril , tylenol  as label directed) May use lidocaine /biofreeze as label directed Ice to affected area Avoid lifting more than 5 lbs, no weight lifting, cross fit or running/treadmill until symptoms resolved/clearance from PCP. If you have loss of function,weakness,dropping things go to ER for further evaluation.

## 2024-08-29 NOTE — ED Triage Notes (Signed)
 Pt states she does cross fit and 3 days ago she started having neck pain that radiates to her left shoulder and down her arm. She has done heat which made it worse, she has done OTC arthritis cream and IBU without relief.

## 2024-08-29 NOTE — ED Provider Notes (Signed)
 [facility]    CSN: [id number] Arrival date & time: 08/29/24  1303      History   Chief Complaint Chief Complaint  Patient presents with   Neck Pain   Shoulder Pain    HPI Brooke Carroll is a 43 y.o. female.   43 year old female, Brooke Carroll,  presents to urgent care for evaluation of left sided neck pain/shoulder pain/that radiates to her left arm down to her index finger.  Patient states she has had this in the past, and after several months of physical therapy it resolved.  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  Patient has used heat ,arthritis cream and ibuprofen  without relief, heat actually made symptoms worse.  Patient denies palpitations, shortness of breath, nausea or vomiting. Pt reports taking steroids,muscle relaxers in past without issue.  The history is provided by the patient. No language interpreter was used.    Past Medical History:  Diagnosis Date   Anxiety    currently no meds-became panicky during CS   Depression    GERD (gastroesophageal reflux disease)    pregnancy related   Ruptured ovarian cyst 2011   WPW (Wolff-Parkinson-White syndrome)    as child- ablation age 49    Patient Active Problem List   Diagnosis Date Noted   Radiculopathy of cervicothoracic region 08/29/2024   Acute strain of neck muscle 08/29/2024   Acute blood loss anemia 09/17/2021   Normal labor 09/16/2021   VBAC, delivered 09/16/2021   Postpartum care following VBAC/VAVB 10/11 09/16/2021   Perineal laceration, second degree and L sulcal 09/16/2021   Acute left ankle pain 08/18/2018   Plantar fascia syndrome 02/11/2018   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   Cesarean delivery delivered 08/15/2015   Status post vacuum-assisted vaginal delivery 07/28/2013    Class: Status post    Past Surgical History:  Procedure Laterality Date   CARDIAC ELECTROPHYSIOLOGY STUDY AND ABLATION  1990   WPW   CESAREAN  SECTION N/A 07/28/2013   Procedure: CESAREAN SECTION; x 1- Surgeon: Norleen GORMAN Skill, MD;  Location: [facility];  Service: Obstetrics;  Laterality: N/A;   CESAREAN SECTION N/A 08/15/2015   Procedure: CESAREAN SECTION;  Surgeon: Alm Cook, MD;  Location: [facility];  Service: Obstetrics;  Laterality: N/A;  Repeat edc 08/20/15 NKDA   DILATION AND EVACUATION N/A 09/05/2020   Procedure: DILATATION AND EVACUATION;  Surgeon: Cook Alm, MD;  Location: [facility];  Service: Gynecology;  Laterality: N/A;    OB History     Gravida  5   Para  3   Term  3   Preterm  0   AB  2   Living  3      SAB  0   IAB  2   Ectopic  0   Multiple  0   Live Births  3            Home Medications    Prior to Admission medications   Medication Sig Start Date End Date Taking? Authorizing Provider  buPROPion (WELLBUTRIN XL) 300 MG 24 hr tablet Take 300 mg by mouth daily. 08/09/24  Yes [provider]  cyclobenzaprine  (FLEXERIL ) 10 MG tablet Take 1 tablet (10 mg total) by mouth every 12 (twelve) hours as needed for muscle spasms. 08/29/24  Yes Somnang Mahan, NP  levonorgestrel (MIRENA) 20 MCG/DAY IUD 1 each by Intrauterine route once.   Yes [provider]  predniSONE  (  DELTASONE ) 10 MG tablet Take 6 tablets by mouth x 1 day, then 5 tablets by mouth x 1 day, then 4 tablets by mouth x 1 day, then 3 tablets by mouth x 1 day, then 2 tabs by mouth x 1 day, then 1 tablet by mouth 08/29/24  Yes Zeta Bucy, Rilla, NP  acetaminophen  (TYLENOL ) 500 MG tablet Take 2 tablets (1,000 mg total) by mouth every 6 (six) hours. 09/17/21   Sigmon, Meredith C, CNM  benzocaine -Menthol  (DERMOPLAST) 20-0.5 % AERO Apply 1 application topically as needed for irritation (perineal discomfort). 09/17/21   Sigmon, Meredith C, CNM  coconut oil OIL Apply 1 application topically as needed. 09/17/21   Sigmon, Hoy BROCKS, CNM  ibuprofen  (ADVIL ) 600 MG tablet Take 1 tablet (600 mg total) by mouth every 6 (six) hours. 09/17/21    Sigmon, Meredith C, CNM  Prenatal Vit-Fe Fumarate-FA (PRENATAL PO) Take 1 tablet by mouth daily.    [provider]  senna-docusate (SENOKOT-S) 8.6-50 MG tablet Take 2 tablets by mouth daily. 09/18/21   Elner Hoy BROCKS, CNM    Family History Family History  Problem Relation Age of Onset   Cancer Mother    Hypertension Father    Heart disease Father    Obesity Father    Arthritis Paternal Grandmother     Social History Social History   Tobacco Use   Smoking status: Never   Smokeless tobacco: Never  Vaping Use   Vaping status: Never Used  Substance Use Topics   Alcohol use: Yes    Alcohol/week: 1.0 standard drink of alcohol    Types: 1 Glasses of wine per week    Comment: occasionally   Drug use: No     Allergies   Patient has no known allergies.   Review of Systems Review of Systems  Musculoskeletal:  Positive for myalgias and neck pain.  All other systems reviewed and are negative.    Physical Exam Triage Vital Signs ED Triage Vitals  Encounter Vitals Group     BP 08/29/24 1352 125/72     Girls Systolic BP Percentile --      Girls Diastolic BP Percentile --      Boys Systolic BP Percentile --      Boys Diastolic BP Percentile --      Pulse Rate 08/29/24 1352 63     Resp 08/29/24 1352 14     Temp 08/29/24 1352 97.9 F (36.6 C)     Temp Source 08/29/24 1352 Oral     SpO2 08/29/24 1352 99 %     Weight --      Height --      Head Circumference --      Peak Flow --      Pain Score 08/29/24 1350 8     Pain Loc --      Pain Education --      Exclude from Growth Chart --    No data found.  Updated Vital Signs BP 125/72 (BP Location: Right Arm)   Pulse 63   Temp 97.9 F (36.6 C) (Oral)   Resp 14   SpO2 99%   Visual Acuity Right Eye Distance:   Left Eye Distance:   Bilateral Distance:    Right Eye Near:   Left Eye Near:    Bilateral Near:     Physical Exam Vitals and nursing note reviewed.  Constitutional:      Appearance: She  is well-developed and well-groomed.  HENT:  Head: Normocephalic.  Neck:      Comments: Area of pain marked Cardiovascular:     Rate and Rhythm: Normal rate.  Pulmonary:     Effort: Pulmonary effort is normal.  Musculoskeletal:       Arms:     Cervical back: Pain with movement and muscular tenderness present. No spinous process tenderness.  Neurological:     General: No focal deficit present.     Mental Status: She is alert and oriented to person, place, and time.     GCS: GCS eye subscore is 4. GCS verbal subscore is 5. GCS motor subscore is 6.     Cranial Nerves: No cranial nerve deficit.     Sensory: Sensation is intact.     Motor: Motor function is intact.     Gait: Gait is intact.     Comments: Handgrips equal bilateral, 5/5 strength bilaterally  Psychiatric:        Attention and Perception: Attention normal.        Mood and Affect: Mood normal.        Speech: Speech normal.        Behavior: Behavior normal. Behavior is cooperative.      UC Treatments / Results  Labs (all labs ordered are listed, but only abnormal results are displayed) Labs Reviewed - No data to display  EKG   Radiology No results found.  Procedures Procedures (including critical care time)  Medications Ordered in UC Medications - No data to display  Initial Impression / Assessment and Plan / UC Course  I have reviewed the triage vital signs and the nursing notes.  Pertinent labs & imaging results that were available during my care of the patient were reviewed by me and considered in my medical decision making (see chart for details).    Discussed exam findings and plan of care with patient, prednisone /flexeril  scripted,strict go to ER precautions given.   Patient verbalized understanding to this provider.  Ddx: Radiculopathy of cervicothoracic region, disc bulge, acute strain of neck muscles, muscle spasm Final Clinical Impressions(s) / UC Diagnoses   Final diagnoses:  Acute  strain of neck muscle, initial encounter  Radiculopathy of cervicothoracic region     Discharge Instructions      Please follow up with PCP, may need physical therapy referral. Take meds as directed(prednisone , flexeril , tylenol  as label directed) May use lidocaine /biofreeze as label directed Ice to affected area Avoid lifting more than 5 lbs, no weight lifting, cross fit or running/treadmill until symptoms resolved/clearance from PCP. If you have loss of function,weakness,dropping things go to ER for further evaluation.      ED Prescriptions     Medication Sig Dispense Auth. Provider   predniSONE  (DELTASONE ) 10 MG tablet Take 6 tablets by mouth x 1 day, then 5 tablets by mouth x 1 day, then 4 tablets by mouth x 1 day, then 3 tablets by mouth x 1 day, then 2 tabs by mouth x 1 day, then 1 tablet by mouth 21 tablet Anevay Campanella, NP   cyclobenzaprine  (FLEXERIL ) 10 MG tablet Take 1 tablet (10 mg total) by mouth every 12 (twelve) hours as needed for muscle spasms. 20 tablet Evonna Stoltz, Rilla, NP      PDMP not reviewed this encounter.   Aminta Rilla, NP 08/29/24 2112

## 2024-08-30 ENCOUNTER — Ambulatory Visit (HOSPITAL_COMMUNITY): Payer: Self-pay
